# Patient Record
Sex: Male | Born: 1960 | Race: White | Hispanic: No | Marital: Married | Smoking: Former smoker
Health system: Southern US, Community
[De-identification: ages and names within clinical notes are randomized; demographics above are authoritative.]

## PROBLEM LIST (undated history)

## (undated) DIAGNOSIS — Z860100 Personal history of colon polyps, unspecified: Secondary | ICD-10-CM

## (undated) DIAGNOSIS — G8929 Other chronic pain: Secondary | ICD-10-CM

## (undated) DIAGNOSIS — G4700 Insomnia, unspecified: Secondary | ICD-10-CM

## (undated) DIAGNOSIS — Z87442 Personal history of urinary calculi: Secondary | ICD-10-CM

## (undated) DIAGNOSIS — I10 Essential (primary) hypertension: Secondary | ICD-10-CM

## (undated) DIAGNOSIS — E785 Hyperlipidemia, unspecified: Secondary | ICD-10-CM

## (undated) DIAGNOSIS — N390 Urinary tract infection, site not specified: Secondary | ICD-10-CM

## (undated) DIAGNOSIS — K649 Unspecified hemorrhoids: Secondary | ICD-10-CM

## (undated) DIAGNOSIS — M6530 Trigger finger, unspecified finger: Secondary | ICD-10-CM

## (undated) DIAGNOSIS — K429 Umbilical hernia without obstruction or gangrene: Secondary | ICD-10-CM

## (undated) DIAGNOSIS — M549 Dorsalgia, unspecified: Secondary | ICD-10-CM

## (undated) DIAGNOSIS — J302 Other seasonal allergic rhinitis: Secondary | ICD-10-CM

## (undated) DIAGNOSIS — L409 Psoriasis, unspecified: Secondary | ICD-10-CM

## (undated) DIAGNOSIS — R21 Rash and other nonspecific skin eruption: Secondary | ICD-10-CM

## (undated) HISTORY — PX: LIPOMA EXCISION: SHX5283

## (undated) HISTORY — PX: COLONOSCOPY: SHX174

## (undated) HISTORY — PX: TENDON REPAIR: SHX5111

## (undated) HISTORY — PX: SHOULDER SURGERY: SHX246

## (undated) HISTORY — DX: Hyperlipidemia, unspecified: E78.5

## (undated) HISTORY — PX: HERNIA REPAIR: SHX51

---

## 2006-02-06 HISTORY — PX: CYSTECTOMY: SUR359

## 2006-03-30 ENCOUNTER — Ambulatory Visit (HOSPITAL_BASED_OUTPATIENT_CLINIC_OR_DEPARTMENT_OTHER): Admission: RE | Admit: 2006-03-30 | Discharge: 2006-03-30 | Payer: Self-pay | Admitting: General Surgery

## 2006-03-30 ENCOUNTER — Encounter (INDEPENDENT_AMBULATORY_CARE_PROVIDER_SITE_OTHER): Payer: Self-pay | Admitting: Specialist

## 2008-03-22 ENCOUNTER — Emergency Department (HOSPITAL_BASED_OUTPATIENT_CLINIC_OR_DEPARTMENT_OTHER): Admission: EM | Admit: 2008-03-22 | Discharge: 2008-03-22 | Payer: Self-pay | Admitting: Emergency Medicine

## 2008-03-22 ENCOUNTER — Ambulatory Visit: Payer: Self-pay | Admitting: Diagnostic Radiology

## 2010-05-24 LAB — URINALYSIS, ROUTINE W REFLEX MICROSCOPIC
Ketones, ur: 15 mg/dL — AB
Nitrite: NEGATIVE
Protein, ur: 100 mg/dL — AB
Specific Gravity, Urine: 1.022 (ref 1.005–1.030)
Urobilinogen, UA: 1 mg/dL (ref 0.0–1.0)

## 2010-05-24 LAB — BASIC METABOLIC PANEL
CO2: 25 mEq/L (ref 19–32)
Chloride: 105 mEq/L (ref 96–112)
GFR calc Af Amer: >60 mL/min (ref >60)
Potassium: 4.2 mEq/L (ref 3.5–5.1)
Sodium: 141 mEq/L (ref 135–145)

## 2010-05-24 LAB — DIFFERENTIAL
Basophils Relative: 0 % (ref 0–1)
Eosinophils Absolute: 0.2 10*3/uL (ref 0.0–0.7)
Lymphs Abs: 2.8 10*3/uL (ref 0.7–4.0)
Monocytes Absolute: 0.5 10*3/uL (ref 0.1–1.0)
Monocytes Relative: 4 % (ref 3–12)

## 2010-05-24 LAB — CBC
Hemoglobin: 15.9 g/dL (ref 13.0–17.0)
MCHC: 33.7 g/dL (ref 30.0–36.0)
MCV: 90.6 fL (ref 78.0–100.0)
RBC: 5.22 MIL/uL (ref 4.22–5.81)

## 2010-06-13 ENCOUNTER — Other Ambulatory Visit: Payer: Self-pay | Admitting: Gastroenterology

## 2010-06-15 ENCOUNTER — Other Ambulatory Visit: Payer: Self-pay | Admitting: Gastroenterology

## 2010-06-15 ENCOUNTER — Ambulatory Visit
Admission: RE | Admit: 2010-06-15 | Discharge: 2010-06-15 | Disposition: A | Discharge status: Home / Self Care | Payer: 59 | Source: Ambulatory Visit | Attending: Gastroenterology | Admitting: Gastroenterology

## 2010-06-15 DIAGNOSIS — R109 Unspecified abdominal pain: Secondary | ICD-10-CM

## 2010-06-16 ENCOUNTER — Other Ambulatory Visit: Payer: Self-pay | Admitting: Gastroenterology

## 2010-06-16 ENCOUNTER — Ambulatory Visit
Admission: RE | Admit: 2010-06-16 | Discharge: 2010-06-16 | Disposition: A | Discharge status: Home / Self Care | Payer: 59 | Source: Ambulatory Visit | Attending: Gastroenterology | Admitting: Gastroenterology

## 2010-06-16 DIAGNOSIS — R112 Nausea with vomiting, unspecified: Secondary | ICD-10-CM

## 2010-06-16 MED ORDER — IOHEXOL 300 MG/ML  SOLN
100.0000 mL | Freq: Once | INTRAMUSCULAR | Status: AC | PRN
  Administered 2010-06-16: 100 mL via INTRAVENOUS

## 2010-06-24 NOTE — Op Note (Signed)
NAMEDEMETRI, Jeffery [name]               ACCOUNT NO.:  [id number]   MEDICAL RECORD NO.:  [id number]          PATIENT TYPE:  AMB   LOCATION:  DSC                          FACILITY:  [facility]   PHYSICIAN:  Cherylynn Ridges, M.D.    DATE OF BIRTH:  Jun 20, 1960   DATE OF PROCEDURE:  03/30/2006  DATE OF DISCHARGE:                               OPERATIVE REPORT   PREOPERATIVE DIAGNOSES:  1. Lipoma of central back.  2. Unknown lesion of the left upper back.   POSTOPERATIVE DIAGNOSES:  1. Lipoma measuring 5 x 3 cm in size.  2. Sebaceous cyst of left upper back measuring 2 cm x 2 cm in size.   PROCEDURE:  1. Excision of lipoma.  2. Excision of left upper back sebaceous cyst.   SURGEON:  Cherylynn Ridges, M.D.   ASSISTANT:  No assistant.   ANESTHESIA:  Monitored anesthesia care with 1% Xylocaine with  epinephrine local.   ESTIMATED BLOOD LOSS:  Less than 30 mL.   COMPLICATIONS:  None.   CONDITION:  Stable.   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   OPERATION:  The patient was taken to the operating room, placed on table  initially in the supine position.  After an adequate amount of sedation  was given, he was placed in the prone position and his back prepped and  draped in usual sterile manner.   We excised the central lipoma initially.  We marked it with marking pen  and then anesthetized the area with 1% Xylocaine approximately 3 to 4 mL  were used.  We used a 15 blade to make the initial incision then we  dissected out the lipoma.  Electrocautery and a figure-of-eight suture  ligature of 3-0 Vicryl used to control hemostasis.  We irrigated it with  saline solution and closed in two layers of 3-0 Vicryl subcu layer and 4-  0 Monocryl subcuticular layer for the skin closure.  The lipoma was sent  off as a separate specimen from the left upper back which was  secondarily excised.   After  resecting the initial lump, the one in the left upper back was  anesthetized in a similar manner with about 3 mL of lidocaine.  An  incision was made with 15 blade and we dissected out what was  subsequently found to be a sebaceous cyst with minimal difficulty using  mostly Metzenbaum scissors to dissect it free from the surrounding  tissue.  We closed it in a single layer of running subcuticular Monocryl  4-0.  Sterile dressings were applied to both wounds.  They were  irrigated with saline.  Of note was that the upper flap on the sebaceous  cyst incision was thin because of the cyst wall and its position  underneath the skin may present a problem with healing subsequently, but  should be handled without problem as an outpatient.  Sterile dressings  were applied to both wounds.      Cherylynn Ridges, M.D.  Electronically Signed  JOW/MEDQ  D:  03/30/2006  T:  03/30/2006  Job:  [id number]

## 2011-08-31 ENCOUNTER — Ambulatory Visit (INDEPENDENT_AMBULATORY_CARE_PROVIDER_SITE_OTHER): Payer: 59 | Admitting: Surgery

## 2011-08-31 ENCOUNTER — Encounter (INDEPENDENT_AMBULATORY_CARE_PROVIDER_SITE_OTHER): Payer: Self-pay | Admitting: Surgery

## 2011-08-31 VITALS — BP 160/102 | HR 80 | Temp 98.2°F | Resp 16 | Ht 71.0 in | Wt 208.6 lb

## 2011-08-31 DIAGNOSIS — R229 Localized swelling, mass and lump, unspecified: Secondary | ICD-10-CM

## 2011-08-31 DIAGNOSIS — R2240 Localized swelling, mass and lump, unspecified lower limb: Secondary | ICD-10-CM

## 2011-08-31 DIAGNOSIS — R2230 Localized swelling, mass and lump, unspecified upper limb: Secondary | ICD-10-CM

## 2011-08-31 NOTE — Progress Notes (Signed)
Subjective:     Patient ID: Jeffery [name], male   DOB: 1960/07/13, 51 y.o.   MRN: [id number]  HPI  Jeffery [name]  [birth year] [id number]  Patient Care Team: Bradd Burner as PCP - General (Physician Assistant)  This patient is a 51 y.o.male who presents today for surgical evaluation at the request of Dr. Janace Litten.  Reason for evaluation: Mass on right leg.  Patient is a pleasant active male.  He has a history of lipomas.  A few were removed on his back and arm by Dr. Lindie Spruce in our group in 2008.  Patient has some SQ lumps in his right forearm and in his right calf.  The one in the right calf was concerning to his primary care physician and therefore the patient was sent to [place] for evaluation.   It's been there for several years.  They've not changed in size.  Unlike the prior masses, these are not particularly bothersome.  No fevers or chills.  No new lumps.  No history of melanomas or skin cancers.  Has a few small moles but they've not changed in size.  He comes today with his wife  There is no problem list on file for this patient.   Past Medical History  Diagnosis Date  . Hyperlipidemia     Past Surgical History  Procedure Date  . Cystectomy     back  . Shoulder surgery     bone spur  . Tendon repair     ankle    History   Social History  . Marital Status: Married    Spouse Name: N/A    Number of Children: N/A  . Years of Education: N/A   Occupational History  . Not on file.   Social History Main Topics  . Smoking status: Former Games developer  . Smokeless tobacco: Not on file  . Alcohol Use: No  . Drug Use: No  . Sexually Active:    Other Topics Concern  . Not on file   Social History Narrative  . No narrative on file    Family History  Problem Relation Age of Onset  . Cancer Mother     breast   . Cancer Maternal Grandfather     lung    Current Outpatient Prescriptions  Medication Sig Dispense Refill  . Cyanocobalamin (VITAMIN B-12 CR PO) Take by  mouth.      . fish oil-omega-3 fatty acids 1000 MG capsule Take 2 g by mouth daily.      . NON FORMULARY Juice Plus      . rosuvastatin (CRESTOR) 5 MG tablet Take 2.5 mg by mouth daily.      Marland Kitchen testosterone enanthate (DELATESTRYL) 200 MG/ML injection Inject 200 mg into the muscle every 14 (fourteen) days. For IM use only      . zolpidem (AMBIEN) 10 MG tablet Take 10 mg by mouth at bedtime as needed.         Allergies  Allergen Reactions  . Morphine And Related Itching    BP 160/102  Pulse 80  Temp 98.2 F (36.8 C) (Temporal)  Resp 16  Ht 5\' 11"  (1.803 m)  Wt 208 lb 9.6 oz (94.62 kg)  BMI 29.09 kg/m2  No results found.   Review of Systems  Constitutional: Negative for fever, chills and diaphoresis.  HENT: Negative for nosebleeds, sore throat, facial swelling, mouth sores, trouble swallowing and ear discharge.   Eyes: Negative for photophobia, discharge and visual disturbance.  Respiratory: Negative for  choking, chest tightness, shortness of breath and stridor.   Cardiovascular: Negative for chest pain and palpitations.  Gastrointestinal: Negative for nausea, vomiting, abdominal pain, diarrhea, constipation, blood in stool, abdominal distention, anal bleeding and rectal pain.  Genitourinary: Negative for dysuria, urgency, difficulty urinating and testicular pain.  Musculoskeletal: Negative for myalgias, back pain, arthralgias and gait problem.  Skin: Negative for color change, pallor, rash and wound.  Neurological: Negative for dizziness, speech difficulty, weakness, numbness and headaches.  Hematological: Negative for adenopathy. Does not bruise/bleed easily.  Psychiatric/Behavioral: Negative for hallucinations, confusion and agitation.       Objective:   Physical Exam  Constitutional: He is oriented to person, place, and time. He appears well-developed and well-nourished. No distress.  HENT:  Head: Normocephalic.  Mouth/Throat: Oropharynx is clear and moist. No  oropharyngeal exudate.  Eyes: Conjunctivae and EOM are normal. Pupils are equal, round, and reactive to light. No scleral icterus.  Neck: Normal range of motion. Neck supple. No tracheal deviation present.  Cardiovascular: Normal rate, regular rhythm and intact distal pulses.   Pulmonary/Chest: Effort normal and breath sounds normal. No respiratory distress.  Abdominal: Soft. He exhibits no distension. There is no tenderness. Hernia confirmed negative in the right inguinal area and confirmed negative in the left inguinal area.  Musculoskeletal: Normal range of motion. He exhibits no tenderness.       Arms:      Legs: Lymphadenopathy:    He has no cervical adenopathy.       Right: No inguinal adenopathy present.       Left: No inguinal adenopathy present.  Neurological: He is alert and oriented to person, place, and time. No cranial nerve deficit. He exhibits normal muscle tone. Coordination normal.  Skin: Skin is warm and dry. No rash noted. He is not diaphoretic. No erythema. No pallor.  Psychiatric: He has a normal mood and affect. His behavior is normal. Judgment and thought content normal.       Assessment:     Subcutaneous masses most consistent with lipomas in a patient with prior lipoma excisions    Plan:     Because the masses have not changed in size nor giving him symptoms, I do not feel strongly that they need to be removed.  The patient feels reassured.  He was managed in removal at this time.  If something changes he wished to have them removed, I think they're superficial enough that they can be removed in the office in a minor or setting with local anesthetic only:  The pathophysiology of skin & subcutaneous masses was discussed.  Natural history risks without surgery were discussed.  I recommended surgery to remove the mass.  I explained the technique of removal with use of local anesthesia & possible need for more aggressive sedation/anesthesia for patient comfort.     Risks such as bleeding, infection, heart attack, death, and other risks were discussed.  I noted a good likelihood this will help address the problem.   Possibility that this will not correct all symptoms was explained. Possibility of regrowth/recurrence of the mass was discussed.  We will work to minimize complications. Questions were answered.  The patient expresses understanding & wishes to hold off with surgery.

## 2011-08-31 NOTE — Patient Instructions (Addendum)
We believe that you have a: Lipoma A lipoma is a noncancerous (benign) tumor composed of fat cells. They are usually found under the skin (subcutaneous). A lipoma may occur in any tissue of the body that contains fat. Common areas for lipomas to appear include the back, shoulders, buttocks, and thighs. Lipomas are a very common soft tissue growth. They are soft and grow slowly. Most problems caused by a lipoma depend on where it is growing. DIAGNOSIS  A lipoma can be diagnosed with a physical exam. These tumors rarely become cancerous, but radiographic studies can help determine this for certain. Studies used may include:  Computerized X-ray scans (CT or CAT scan).   Computerized magnetic scans (MRI).  TREATMENT  Small lipomas that are not causing problems may be watched. If a lipoma continues to enlarge or causes problems, removal is often the best treatment. Lipomas can also be removed to improve appearance. Surgery is done to remove the fatty cells and the surrounding capsule. Most often, this is done with medicine that numbs the area (local anesthetic). The removed tissue is examined under a microscope to make sure it is not cancerous. Keep all follow-up appointments with your caregiver. SEEK MEDICAL CARE IF:   The lipoma becomes larger or hard.   The lipoma becomes painful, red, or increasingly swollen. These could be signs of infection or a more serious condition.  Document Released: 01/13/2002 Document Revised: 01/12/2011 Document Reviewed: 06/25/2009 [facility] Patient Information 2012 [place], [place].

## 2011-09-13 ENCOUNTER — Ambulatory Visit (INDEPENDENT_AMBULATORY_CARE_PROVIDER_SITE_OTHER): Payer: 59 | Admitting: Surgery

## 2012-01-11 ENCOUNTER — Other Ambulatory Visit: Payer: Self-pay | Admitting: Family Medicine

## 2012-01-11 ENCOUNTER — Ambulatory Visit
Admission: RE | Admit: 2012-01-11 | Discharge: 2012-01-11 | Disposition: A | Discharge status: Home / Self Care | Payer: 59 | Source: Ambulatory Visit | Attending: Family Medicine | Admitting: Family Medicine

## 2012-01-11 DIAGNOSIS — R3915 Urgency of urination: Secondary | ICD-10-CM

## 2012-01-11 DIAGNOSIS — R509 Fever, unspecified: Secondary | ICD-10-CM

## 2012-01-11 DIAGNOSIS — R310 Gross hematuria: Secondary | ICD-10-CM

## 2012-01-11 DIAGNOSIS — M549 Dorsalgia, unspecified: Secondary | ICD-10-CM

## 2012-03-27 ENCOUNTER — Other Ambulatory Visit: Payer: Self-pay | Admitting: Neurosurgery

## 2012-04-19 ENCOUNTER — Encounter (HOSPITAL_COMMUNITY): Payer: Self-pay | Admitting: Pharmacy Technician

## 2012-04-30 ENCOUNTER — Ambulatory Visit (HOSPITAL_COMMUNITY)
Admission: RE | Admit: 2012-04-30 | Discharge: 2012-04-30 | Disposition: A | Discharge status: Home / Self Care | Payer: 59 | Source: Ambulatory Visit | Attending: Anesthesiology | Admitting: Anesthesiology

## 2012-04-30 ENCOUNTER — Encounter (HOSPITAL_COMMUNITY): Payer: Self-pay

## 2012-04-30 ENCOUNTER — Encounter (HOSPITAL_COMMUNITY)
Admission: RE | Admit: 2012-04-30 | Discharge: 2012-04-30 | Disposition: A | Discharge status: Home / Self Care | Payer: 59 | Source: Ambulatory Visit | Attending: Neurosurgery | Admitting: Neurosurgery

## 2012-04-30 DIAGNOSIS — Z01812 Encounter for preprocedural laboratory examination: Secondary | ICD-10-CM | POA: Insufficient documentation

## 2012-04-30 DIAGNOSIS — Z01810 Encounter for preprocedural cardiovascular examination: Secondary | ICD-10-CM | POA: Insufficient documentation

## 2012-04-30 DIAGNOSIS — Z01818 Encounter for other preprocedural examination: Secondary | ICD-10-CM | POA: Insufficient documentation

## 2012-04-30 DIAGNOSIS — Z01811 Encounter for preprocedural respiratory examination: Secondary | ICD-10-CM | POA: Insufficient documentation

## 2012-04-30 HISTORY — DX: Dorsalgia, unspecified: M54.9

## 2012-04-30 HISTORY — DX: Psoriasis, unspecified: L40.9

## 2012-04-30 HISTORY — DX: Unspecified hemorrhoids: K64.9

## 2012-04-30 HISTORY — DX: Essential (primary) hypertension: I10

## 2012-04-30 HISTORY — DX: Personal history of urinary calculi: Z87.442

## 2012-04-30 HISTORY — DX: Insomnia, unspecified: G47.00

## 2012-04-30 HISTORY — DX: Urinary tract infection, site not specified: N39.0

## 2012-04-30 HISTORY — DX: Personal history of colonic polyps: Z86.010

## 2012-04-30 HISTORY — DX: Other seasonal allergic rhinitis: J30.2

## 2012-04-30 HISTORY — DX: Personal history of colon polyps, unspecified: Z86.0100

## 2012-04-30 HISTORY — DX: Trigger finger, unspecified finger: M65.30

## 2012-04-30 HISTORY — DX: Rash and other nonspecific skin eruption: R21

## 2012-04-30 HISTORY — DX: Other chronic pain: G89.29

## 2012-04-30 HISTORY — DX: Umbilical hernia without obstruction or gangrene: K42.9

## 2012-04-30 LAB — BASIC METABOLIC PANEL
Chloride: 109 mEq/L (ref 96–112)
GFR calc Af Amer: >90 mL/min (ref >90)
GFR calc non Af Amer: >90 mL/min (ref >90)
Potassium: 3.9 mEq/L (ref 3.5–5.1)
Sodium: 141 mEq/L (ref 135–145)

## 2012-04-30 LAB — TYPE AND SCREEN
ABO/RH(D): A POS
Antibody Screen: NEGATIVE

## 2012-04-30 LAB — SURGICAL PCR SCREEN
MRSA, PCR: NEGATIVE
Staphylococcus aureus: NEGATIVE

## 2012-04-30 LAB — CBC
HCT: 50.3 % (ref 39.0–52.0)
Hemoglobin: 18.2 g/dL — ABNORMAL HIGH (ref 13.0–17.0)
RBC: 5.72 MIL/uL (ref 4.22–5.81)

## 2012-04-30 NOTE — Pre-Procedure Instructions (Signed)
Pleasant Bensinger  04/30/2012   Your procedure is scheduled on:  Wed, April 2 @ 10:00 AM  Report to [facility] at 7:00 AM.  Call this number if you have problems the morning of surgery: [phone]   Remember:   Do not eat food or drink liquids after midnight.   Take these medicines the morning of surgery with A SIP OF WATER: Claritin(Loratadine)               Stop taking your Fish Oil.No Aspirins,Goody's,BC's,Ibuprofen,or Herbal Medications   Do not wear jewelry  Do not wear lotions, powders, or colognes. You may wear deodorant.             Men may shave face and neck.  Do not bring valuables to the hospital.  Contacts, dentures or bridgework may not be worn into surgery.  Leave suitcase in the car. After surgery it may be brought to your room.  For patients admitted to the hospital, checkout time is 11:00 AM the day of  discharge.   Patients discharged the day of surgery will not be allowed to drive  home.    Special Instructions: Shower using CHG 2 nights before surgery and the night before surgery.  If you shower the day of surgery use CHG.  Use special wash - you have one bottle of CHG for all showers.  You should use approximately 1/3 of the bottle for each shower.   Please read over the following fact sheets that you were given: Pain Booklet, Coughing and Deep Breathing, Blood Transfusion Information, MRSA Information and Surgical Site Infection Prevention

## 2012-04-30 NOTE — Progress Notes (Signed)
Pt doesn't have a cardiologist  Denies ever having an echo/stress test/heart cath  Dr.Christian Janace Litten is Medical Md at [facility]  Denies EKG or CXR in past yr

## 2012-05-07 MED ORDER — CEFAZOLIN SODIUM-DEXTROSE 2-3 GM-% IV SOLR
2.0000 g | INTRAVENOUS | Status: AC
  Administered 2012-05-08: 2 g via INTRAVENOUS
  Filled 2012-05-07 (×2): qty 50

## 2012-05-08 ENCOUNTER — Ambulatory Visit (HOSPITAL_COMMUNITY): Payer: 59

## 2012-05-08 ENCOUNTER — Encounter (HOSPITAL_COMMUNITY)
Admission: RE | Disposition: A | Discharge status: Home / Self Care | Payer: Self-pay | Source: Ambulatory Visit | Attending: Neurosurgery

## 2012-05-08 ENCOUNTER — Ambulatory Visit (HOSPITAL_COMMUNITY): Payer: 59 | Admitting: Anesthesiology

## 2012-05-08 ENCOUNTER — Encounter (HOSPITAL_COMMUNITY): Payer: Self-pay | Admitting: Anesthesiology

## 2012-05-08 ENCOUNTER — Encounter (HOSPITAL_COMMUNITY): Payer: Self-pay | Admitting: *Deleted

## 2012-05-08 ENCOUNTER — Inpatient Hospital Stay (HOSPITAL_COMMUNITY)
Admission: RE | Admit: 2012-05-08 | Discharge: 2012-05-10 | DRG: 460 | Disposition: A | Discharge status: Home / Self Care | Payer: 59 | Source: Ambulatory Visit | Attending: Neurosurgery | Admitting: Neurosurgery

## 2012-05-08 DIAGNOSIS — I10 Essential (primary) hypertension: Secondary | ICD-10-CM | POA: Diagnosis present

## 2012-05-08 DIAGNOSIS — Q762 Congenital spondylolisthesis: Principal | ICD-10-CM

## 2012-05-08 DIAGNOSIS — L408 Other psoriasis: Secondary | ICD-10-CM | POA: Diagnosis present

## 2012-05-08 DIAGNOSIS — E785 Hyperlipidemia, unspecified: Secondary | ICD-10-CM | POA: Diagnosis present

## 2012-05-08 DIAGNOSIS — Z87891 Personal history of nicotine dependence: Secondary | ICD-10-CM

## 2012-05-08 DIAGNOSIS — Z79899 Other long term (current) drug therapy: Secondary | ICD-10-CM

## 2012-05-08 DIAGNOSIS — Z860100 Personal history of colon polyps, unspecified: Secondary | ICD-10-CM

## 2012-05-08 DIAGNOSIS — G4700 Insomnia, unspecified: Secondary | ICD-10-CM | POA: Diagnosis present

## 2012-05-08 DIAGNOSIS — Z87442 Personal history of urinary calculi: Secondary | ICD-10-CM

## 2012-05-08 SURGERY — POSTERIOR LUMBAR FUSION 1 LEVEL
Anesthesia: General | Site: Back | Wound class: Clean

## 2012-05-08 MED ORDER — FENTANYL CITRATE 0.05 MG/ML IJ SOLN
INTRAMUSCULAR | Status: DC | PRN
  Administered 2012-05-08 (×2): 50 ug via INTRAVENOUS
  Administered 2012-05-08: 100 ug via INTRAVENOUS
  Administered 2012-05-08 (×4): 50 ug via INTRAVENOUS

## 2012-05-08 MED ORDER — ACETAMINOPHEN 325 MG PO TABS
650.0000 mg | ORAL_TABLET | ORAL | Status: DC | PRN
  Administered 2012-05-10 (×4): 650 mg via ORAL
  Filled 2012-05-08 (×5): qty 2

## 2012-05-08 MED ORDER — PHENYLEPHRINE HCL 10 MG/ML IJ SOLN
INTRAMUSCULAR | Status: DC | PRN
Start: 2012-05-08 — End: 2012-05-08
  Administered 2012-05-08: 80 ug via INTRAVENOUS

## 2012-05-08 MED ORDER — FENTANYL CITRATE 0.05 MG/ML IJ SOLN
25.0000 ug | INTRAMUSCULAR | Status: DC | PRN
  Administered 2012-05-08 (×2): 50 ug via INTRAVENOUS

## 2012-05-08 MED ORDER — HYDROMORPHONE 0.3 MG/ML IV SOLN
INTRAVENOUS | Status: AC
  Filled 2012-05-08: qty 25

## 2012-05-08 MED ORDER — GLYCOPYRROLATE 0.2 MG/ML IJ SOLN
INTRAMUSCULAR | Status: DC | PRN
Start: 2012-05-08 — End: 2012-05-08
  Administered 2012-05-08: .5 mg via INTRAVENOUS

## 2012-05-08 MED ORDER — SODIUM CHLORIDE 0.9 % IJ SOLN
3.0000 mL | Freq: Two times a day (BID) | INTRAMUSCULAR | Status: DC
  Administered 2012-05-09: 3 mL via INTRAVENOUS

## 2012-05-08 MED ORDER — SODIUM CHLORIDE 0.9 % IV SOLN: INTRAVENOUS | Status: DC

## 2012-05-08 MED ORDER — OXYCODONE-ACETAMINOPHEN 5-325 MG PO TABS
1.0000 | ORAL_TABLET | ORAL | Status: DC | PRN
  Administered 2012-05-08 – 2012-05-09 (×2): 2 via ORAL
  Filled 2012-05-08: qty 2

## 2012-05-08 MED ORDER — POLYETHYLENE GLYCOL 3350 17 G PO PACK
17.0000 g | PACK | Freq: Every day | ORAL | Status: DC | PRN
  Filled 2012-05-08: qty 1

## 2012-05-08 MED ORDER — BUPIVACAINE-EPINEPHRINE PF 0.5-1:200000 % IJ SOLN: INTRAMUSCULAR | Status: DC | PRN

## 2012-05-08 MED ORDER — MIDAZOLAM HCL 2 MG/2ML IJ SOLN: 1.0000 mg | INTRAMUSCULAR | Status: DC | PRN

## 2012-05-08 MED ORDER — ONDANSETRON HCL 4 MG/2ML IJ SOLN
4.0000 mg | INTRAMUSCULAR | Status: DC | PRN
  Administered 2012-05-09: 4 mg via INTRAVENOUS
  Filled 2012-05-08: qty 2

## 2012-05-08 MED ORDER — NALOXONE HCL 0.4 MG/ML IJ SOLN: 0.4000 mg | INTRAMUSCULAR | Status: DC | PRN

## 2012-05-08 MED ORDER — ARMODAFINIL 150 MG PO TABS: 150.0000 mg | ORAL_TABLET | Freq: Every day | ORAL | Status: DC

## 2012-05-08 MED ORDER — FENTANYL CITRATE 0.05 MG/ML IJ SOLN
INTRAMUSCULAR | Status: AC
  Filled 2012-05-08: qty 2

## 2012-05-08 MED ORDER — ACETAMINOPHEN 10 MG/ML IV SOLN
1000.0000 mg | Freq: Once | INTRAVENOUS | Status: AC
  Administered 2012-05-08: 1000 mg via INTRAVENOUS
  Filled 2012-05-08: qty 100

## 2012-05-08 MED ORDER — DIPHENHYDRAMINE HCL 12.5 MG/5ML PO ELIX: 12.5000 mg | ORAL_SOLUTION | Freq: Four times a day (QID) | ORAL | Status: DC | PRN

## 2012-05-08 MED ORDER — DIAZEPAM 5 MG PO TABS
ORAL_TABLET | ORAL | Status: AC
  Filled 2012-05-08: qty 1

## 2012-05-08 MED ORDER — MIDAZOLAM HCL 5 MG/5ML IJ SOLN
INTRAMUSCULAR | Status: DC | PRN
  Administered 2012-05-08: 2 mg via INTRAVENOUS

## 2012-05-08 MED ORDER — CEFAZOLIN SODIUM 1-5 GM-% IV SOLN
1.0000 g | Freq: Three times a day (TID) | INTRAVENOUS | Status: AC
  Administered 2012-05-08 – 2012-05-09 (×2): 1 g via INTRAVENOUS
  Filled 2012-05-08 (×2): qty 50

## 2012-05-08 MED ORDER — ACETAMINOPHEN 650 MG RE SUPP: 650.0000 mg | RECTAL | Status: DC | PRN

## 2012-05-08 MED ORDER — PHENOL 1.4 % MT LIQD: 1.0000 | OROMUCOSAL | Status: DC | PRN

## 2012-05-08 MED ORDER — LACTATED RINGERS IV SOLN
INTRAVENOUS | Status: DC | PRN
  Administered 2012-05-08 (×3): via INTRAVENOUS

## 2012-05-08 MED ORDER — SURGIFOAM 100 EX MISC
CUTANEOUS | Status: DC | PRN
  Administered 2012-05-08: 12:00:00 via TOPICAL

## 2012-05-08 MED ORDER — ATORVASTATIN CALCIUM 10 MG PO TABS
10.0000 mg | ORAL_TABLET | Freq: Every day | ORAL | Status: DC
  Administered 2012-05-09: 10 mg via ORAL
  Filled 2012-05-08 (×3): qty 1

## 2012-05-08 MED ORDER — ARTIFICIAL TEARS OP OINT
TOPICAL_OINTMENT | OPHTHALMIC | Status: DC | PRN
  Administered 2012-05-08: 1 via OPHTHALMIC

## 2012-05-08 MED ORDER — 0.9 % SODIUM CHLORIDE (POUR BTL) OPTIME
TOPICAL | Status: DC | PRN
  Administered 2012-05-08: 1000 mL

## 2012-05-08 MED ORDER — OXYCODONE-ACETAMINOPHEN 5-325 MG PO TABS
ORAL_TABLET | ORAL | Status: AC
  Filled 2012-05-08: qty 2

## 2012-05-08 MED ORDER — DIPHENHYDRAMINE HCL 50 MG/ML IJ SOLN: 12.5000 mg | Freq: Four times a day (QID) | INTRAMUSCULAR | Status: DC | PRN

## 2012-05-08 MED ORDER — SODIUM CHLORIDE 0.9 % IJ SOLN: 9.0000 mL | INTRAMUSCULAR | Status: DC | PRN

## 2012-05-08 MED ORDER — BUPIVACAINE LIPOSOME 1.3 % IJ SUSP
20.0000 mL | INTRAMUSCULAR | Status: AC
  Filled 2012-05-08: qty 20

## 2012-05-08 MED ORDER — ACETAMINOPHEN 10 MG/ML IV SOLN
INTRAVENOUS | Status: AC
  Filled 2012-05-08: qty 100

## 2012-05-08 MED ORDER — BUPIVACAINE LIPOSOME 1.3 % IJ SUSP
INTRAMUSCULAR | Status: DC | PRN
  Administered 2012-05-08: 20 mL

## 2012-05-08 MED ORDER — SODIUM CHLORIDE 0.9 % IJ SOLN: 3.0000 mL | INTRAMUSCULAR | Status: DC | PRN

## 2012-05-08 MED ORDER — SODIUM CHLORIDE 0.9 % IV SOLN: 250.0000 mL | INTRAVENOUS | Status: DC

## 2012-05-08 MED ORDER — MENTHOL 3 MG MT LOZG: 1.0000 | LOZENGE | OROMUCOSAL | Status: DC | PRN

## 2012-05-08 MED ORDER — LIDOCAINE HCL (CARDIAC) 20 MG/ML IV SOLN
INTRAVENOUS | Status: DC | PRN
  Administered 2012-05-08: 100 mg via INTRAVENOUS

## 2012-05-08 MED ORDER — ONDANSETRON HCL 4 MG/2ML IJ SOLN: 4.0000 mg | Freq: Four times a day (QID) | INTRAMUSCULAR | Status: DC | PRN

## 2012-05-08 MED ORDER — ROCURONIUM BROMIDE 100 MG/10ML IV SOLN
INTRAVENOUS | Status: DC | PRN
Start: 2012-05-08 — End: 2012-05-08
  Administered 2012-05-08: 50 mg via INTRAVENOUS

## 2012-05-08 MED ORDER — NEOSTIGMINE METHYLSULFATE 1 MG/ML IJ SOLN
INTRAMUSCULAR | Status: DC | PRN
  Administered 2012-05-08: 4 mg via INTRAVENOUS

## 2012-05-08 MED ORDER — ZOLPIDEM TARTRATE 5 MG PO TABS: 10.0000 mg | ORAL_TABLET | Freq: Every evening | ORAL | Status: DC | PRN

## 2012-05-08 MED ORDER — DIAZEPAM 5 MG PO TABS
5.0000 mg | ORAL_TABLET | Freq: Four times a day (QID) | ORAL | Status: DC | PRN
  Administered 2012-05-08 – 2012-05-09 (×3): 5 mg via ORAL
  Filled 2012-05-08 (×2): qty 1

## 2012-05-08 MED ORDER — VECURONIUM BROMIDE 10 MG IV SOLR
INTRAVENOUS | Status: DC | PRN
Start: 2012-05-08 — End: 2012-05-08
  Administered 2012-05-08: 2 mg via INTRAVENOUS
  Administered 2012-05-08: 3 mg via INTRAVENOUS
  Administered 2012-05-08: 2 mg via INTRAVENOUS

## 2012-05-08 MED ORDER — HYDROMORPHONE 0.3 MG/ML IV SOLN
INTRAVENOUS | Status: DC
  Administered 2012-05-08: 3 mg via INTRAVENOUS
  Administered 2012-05-08: 14:00:00 via INTRAVENOUS
  Administered 2012-05-09: 1.2 mg via INTRAVENOUS
  Administered 2012-05-09: 1.5 mg via INTRAVENOUS

## 2012-05-08 MED ORDER — PROMETHAZINE HCL 25 MG/ML IJ SOLN: 6.2500 mg | INTRAMUSCULAR | Status: DC | PRN

## 2012-05-08 MED ORDER — PROPOFOL 10 MG/ML IV BOLUS
INTRAVENOUS | Status: DC | PRN
  Administered 2012-05-08: 200 mg via INTRAVENOUS

## 2012-05-08 MED ORDER — ONDANSETRON HCL 4 MG/2ML IJ SOLN
INTRAMUSCULAR | Status: DC | PRN
  Administered 2012-05-08: 4 mg via INTRAVENOUS

## 2012-05-08 MED ORDER — FENTANYL CITRATE 0.05 MG/ML IJ SOLN
50.0000 ug | Freq: Once | INTRAMUSCULAR | Status: AC
  Administered 2012-05-08: 50 ug via INTRAVENOUS

## 2012-05-08 SURGICAL SUPPLY — 74 items
APL SKNCLS STERI-STRIP NONHPOA (GAUZE/BANDAGES/DRESSINGS) ×1
BENZOIN TINCTURE PRP APPL 2/3 (GAUZE/BANDAGES/DRESSINGS) ×2 IMPLANT
BLADE SURG ROTATE 9660 (MISCELLANEOUS) IMPLANT
BONE EQUIVA 10CC (Bone Implant) ×1 IMPLANT
BUR ACORN 6.0 (BURR) ×2 IMPLANT
BUR MATCHSTICK NEURO 3.0 LAGG (BURR) ×2 IMPLANT
CANISTER SUCTION 2500CC (MISCELLANEOUS) ×2 IMPLANT
CAP REVERE LOCKING (Cap) ×4 IMPLANT
CLOTH BEACON ORANGE TIMEOUT ST (SAFETY) ×2 IMPLANT
CONN CROSSLINK REV 38-50MM (Connector) ×2 IMPLANT
CONNECTOR CRSLINK REV 38-50MM (Connector) IMPLANT
CONT SPEC 4OZ CLIKSEAL STRL BL (MISCELLANEOUS) ×3 IMPLANT
COVER BACK TABLE 24X17X13 BIG (DRAPES) IMPLANT
COVER TABLE BACK 60X90 (DRAPES) ×2 IMPLANT
DRAPE C-ARM 42X72 X-RAY (DRAPES) ×4 IMPLANT
DRAPE LAPAROTOMY 100X72X124 (DRAPES) ×2 IMPLANT
DRAPE POUCH INSTRU U-SHP 10X18 (DRAPES) ×2 IMPLANT
DRSG PAD ABDOMINAL 8X10 ST (GAUZE/BANDAGES/DRESSINGS) IMPLANT
DURAPREP 26ML APPLICATOR (WOUND CARE) ×2 IMPLANT
ELECT BLADE 4.0 EZ CLEAN MEGAD (MISCELLANEOUS) ×4
ELECT REM PT RETURN 9FT ADLT (ELECTROSURGICAL) ×2
ELECTRODE BLDE 4.0 EZ CLN MEGD (MISCELLANEOUS) IMPLANT
ELECTRODE REM PT RTRN 9FT ADLT (ELECTROSURGICAL) ×1 IMPLANT
EVACUATOR 1/8 PVC DRAIN (DRAIN) IMPLANT
GAUZE SPONGE 4X4 16PLY XRAY LF (GAUZE/BANDAGES/DRESSINGS) ×3 IMPLANT
GLOVE BIO SURGEON STRL SZ 6.5 (GLOVE) ×2 IMPLANT
GLOVE BIOGEL M 8.0 STRL (GLOVE) ×3 IMPLANT
GLOVE BIOGEL PI IND STRL 8 (GLOVE) IMPLANT
GLOVE BIOGEL PI INDICATOR 8 (GLOVE) ×1
GLOVE ECLIPSE 7.5 STRL STRAW (GLOVE) ×1 IMPLANT
GLOVE EXAM NITRILE LRG STRL (GLOVE) IMPLANT
GLOVE EXAM NITRILE MD LF STRL (GLOVE) IMPLANT
GLOVE EXAM NITRILE XL STR (GLOVE) IMPLANT
GLOVE EXAM NITRILE XS STR PU (GLOVE) IMPLANT
GLOVE SURG SS PI 6.5 STRL IVOR (GLOVE) ×1 IMPLANT
GOWN BRE IMP SLV AUR LG STRL (GOWN DISPOSABLE) ×3 IMPLANT
GOWN BRE IMP SLV AUR XL STRL (GOWN DISPOSABLE) ×4 IMPLANT
GOWN STRL REIN 2XL LVL4 (GOWN DISPOSABLE) ×1 IMPLANT
KIT BASIN OR (CUSTOM PROCEDURE TRAY) ×2 IMPLANT
KIT ROOM TURNOVER OR (KITS) ×2 IMPLANT
MILL MEDIUM DISP (BLADE) ×1 IMPLANT
NDL HYPO 18GX1.5 BLUNT FILL (NEEDLE) IMPLANT
NDL HYPO 21X1 ECLIPSE (NEEDLE) IMPLANT
NDL HYPO 21X1.5 SAFETY (NEEDLE) IMPLANT
NDL HYPO 25X1 1.5 SAFETY (NEEDLE) IMPLANT
NEEDLE HYPO 18GX1.5 BLUNT FILL (NEEDLE) IMPLANT
NEEDLE HYPO 21X1 ECLIPSE (NEEDLE) ×2 IMPLANT
NEEDLE HYPO 21X1.5 SAFETY (NEEDLE) IMPLANT
NEEDLE HYPO 25X1 1.5 SAFETY (NEEDLE) IMPLANT
NS IRRIG 1000ML POUR BTL (IV SOLUTION) ×2 IMPLANT
PACK LAMINECTOMY NEURO (CUSTOM PROCEDURE TRAY) ×2 IMPLANT
PAD ARMBOARD 7.5X6 YLW CONV (MISCELLANEOUS) ×6 IMPLANT
PATTIES SURGICAL .5 X1 (DISPOSABLE) ×1 IMPLANT
PATTIES SURGICAL .5 X3 (DISPOSABLE) IMPLANT
ROD REVERE 6.35 45MM (Rod) ×2 IMPLANT
SCREW REVERE 6.35 6.5MMX45 (Screw) ×4 IMPLANT
SPACER SUSTAIN O SML 8X22 8MM (Spacer) ×2 IMPLANT
SPONGE GAUZE 4X4 12PLY (GAUZE/BANDAGES/DRESSINGS) ×2 IMPLANT
SPONGE LAP 4X18 X RAY DECT (DISPOSABLE) IMPLANT
SPONGE NEURO XRAY DETECT 1X3 (DISPOSABLE) IMPLANT
SPONGE SURGIFOAM ABS GEL 100 (HEMOSTASIS) ×2 IMPLANT
STRIP CLOSURE SKIN 1/2X4 (GAUZE/BANDAGES/DRESSINGS) ×2 IMPLANT
SUT VIC AB 1 CT1 18XBRD ANBCTR (SUTURE) ×2 IMPLANT
SUT VIC AB 1 CT1 8-18 (SUTURE) ×4
SUT VIC AB 2-0 CP2 18 (SUTURE) ×3 IMPLANT
SUT VIC AB 3-0 SH 8-18 (SUTURE) ×2 IMPLANT
SYR 20CC LL (SYRINGE) IMPLANT
SYR 20ML ECCENTRIC (SYRINGE) ×2 IMPLANT
SYR 5ML LL (SYRINGE) ×1 IMPLANT
TAPE CLOTH SURG 4X10 WHT LF (GAUZE/BANDAGES/DRESSINGS) ×1 IMPLANT
TOWEL OR 17X24 6PK STRL BLUE (TOWEL DISPOSABLE) ×2 IMPLANT
TOWEL OR 17X26 10 PK STRL BLUE (TOWEL DISPOSABLE) ×3 IMPLANT
TRAY FOLEY CATH 14FRSI W/METER (CATHETERS) ×2 IMPLANT
WATER STERILE IRR 1000ML POUR (IV SOLUTION) ×2 IMPLANT

## 2012-05-08 NOTE — Preoperative (Signed)
Beta Blockers   Reason not to administer Beta Blockers:Not Applicable 

## 2012-05-08 NOTE — Anesthesia Preprocedure Evaluation (Addendum)
Anesthesia Evaluation  Patient identified by MRN, date of birth, ID band Patient awake    Reviewed: Allergy & Precautions, H&P , NPO status , Patient's Chart, lab work & pertinent test results  History of Anesthesia Complications (+) AWARENESS UNDER ANESTHESIANegative for: history of anesthetic complications  Airway Mallampati: III TM Distance: >3 FB Neck ROM: Full    Dental  (+) Teeth Intact and Dental Advisory Given   Pulmonary former smoker,  25 pack year smoking history. Quit 8 years ago. breath sounds clear to auscultation        Cardiovascular hypertension, Pt. on medications Rhythm:Regular Rate:Normal     Neuro/Psych  Neuromuscular disease negative psych ROS   GI/Hepatic negative GI ROS, Neg liver ROS,   Endo/Other  negative endocrine ROS  Renal/GU negative Renal ROS     Musculoskeletal   Abdominal   Peds  Hematology negative hematology ROS (+)   Anesthesia Other Findings   Reproductive/Obstetrics negative OB ROS                         Anesthesia Physical Anesthesia Plan  ASA: II  Anesthesia Plan: General   Post-op Pain Management:    Induction: Intravenous  Airway Management Planned: Oral ETT  Additional Equipment:   Intra-op Plan:   Post-operative Plan: Extubation in OR  Informed Consent: I have reviewed the patients History and Physical, chart, labs and discussed the procedure including the risks, benefits and alternatives for the proposed anesthesia with the patient or authorized representative who has indicated his/her understanding and acceptance.     Plan Discussed with: CRNA and Surgeon  Anesthesia Plan Comments:         Anesthesia Quick Evaluation

## 2012-05-08 NOTE — Anesthesia Postprocedure Evaluation (Signed)
  Anesthesia Post-op Note  Patient: Jeffery [name]  Procedure(s) Performed: Procedure(s) with comments: Lumbar Threee to Four Diskectomy/fusion/cages/posterolateral arthrodesis/pedicle screws (N/A) - POSTERIOR LUMBAR FUSION 1 LEVEL/cellsaver  Patient Location: PACU  Anesthesia Type:General  Level of Consciousness: awake  Airway and Oxygen Therapy: Patient Spontanous Breathing  Post-op Pain: mild  Post-op Assessment: Post-op Vital signs reviewed, Patient's Cardiovascular Status Stable, Respiratory Function Stable, Patent Airway, No signs of Nausea or vomiting and Pain level controlled  Post-op Vital Signs: stable  Complications: No apparent anesthesia complications

## 2012-05-08 NOTE — Transfer of Care (Signed)
Immediate Anesthesia Transfer of Care Note  Patient: Jeffery [name]  Procedure(s) Performed: Procedure(s) with comments: Lumbar Threee to Four Diskectomy/fusion/cages/posterolateral arthrodesis/pedicle screws (N/A) - POSTERIOR LUMBAR FUSION 1 LEVEL/cellsaver  Patient Location: PACU  Anesthesia Type:General  Level of Consciousness: oriented, sedated and patient cooperative  Airway & Oxygen Therapy: Patient Spontanous Breathing and Patient connected to nasal cannula oxygen  Post-op Assessment: Report given to PACU RN and Post -op Vital signs reviewed and stable  Post vital signs: Reviewed and stable  Complications: No apparent anesthesia complications

## 2012-05-08 NOTE — H&P (Signed)
Jeffery [name] is an 52 y.o. male.   Chief Complaint: lbp HPI: lower back pain with radiation to both lower extremities with numbness to the groin and right knee. He has failed with conservative treatment.   Past Medical History  Diagnosis Date  . Hyperlipidemia     takes Crestor daily  . Hypertension     pt states since gaining weight B/P is elevated not on meds  . Insomnia     takes Ambien and Melatonin  . Seasonal allergies     takes Claritin daily  . Chronic back pain     DDD  . Trigger finger     right   . Rash     pt states always has  . Psoriasis   . Umbilical hernia   . Hemorrhoids   . History of colon polyps   . UTI (lower urinary tract infection)     last time a month ago and was treated  . History of kidney stones     Past Surgical History  Procedure Laterality Date  . Cystectomy  2008    back  . Shoulder surgery Right     bone spur  . Tendon repair Right     ankle  . Lipoma excision      right arm   . Hernia repair  70's  . Colonoscopy      Family History  Problem Relation Age of Onset  . Cancer Mother     breast   . Cancer Maternal Grandfather     lung   Social History:  reports that he has quit smoking. He does not have any smokeless tobacco history on file. He reports that he does not drink alcohol or use illicit drugs.  Allergies:  Allergies  Allergen Reactions  . Morphine And Related Itching    Medications Prior to Admission  Medication Sig Dispense Refill  . Armodafinil (NUVIGIL) 150 MG tablet Take 150 mg by mouth daily.      . fish oil-omega-3 fatty acids 1000 MG capsule Take 1 g by mouth daily.       Marland Kitchen loratadine-pseudoephedrine (CLARITIN-D 24-HOUR) 10-240 MG per 24 hr tablet Take 1 tablet by mouth daily.      . Melatonin 10 MG TABS Take 1 tablet by mouth at bedtime.      . Nutritional Supplements (JUICE PLUS FIBRE PO) Take 2 capsules by mouth every morning. "[employer]      . Nutritional Supplements (JUICE PLUS FIBRE PO) Take 2  capsules by mouth at bedtime. "[employer]      . rosuvastatin (CRESTOR) 5 MG tablet Take 2.5 mg by mouth daily.      Marland Kitchen testosterone enanthate (DELATESTRYL) 200 MG/ML injection Inject 200 mg into the muscle every 7 (seven) days. Saturday or Sunday      . vitamin B-12 (CYANOCOBALAMIN) 500 MCG tablet Take 500 mcg by mouth daily.      Marland Kitchen zolpidem (AMBIEN) 10 MG tablet Take 10 mg by mouth at bedtime as needed.        No results found for this or any previous visit (from the past 48 hour(s)). No results found.  Review of Systems  Constitutional: Negative.   HENT: Negative.   Eyes: Negative.   Respiratory: Negative.   Cardiovascular: Negative.   Gastrointestinal: Negative.   Genitourinary:       Kidney stones  Musculoskeletal: Positive for back pain.  Skin: Negative.   Neurological: Positive for focal weakness.  Endo/Heme/Allergies: Negative.  Psychiatric/Behavioral: Negative.     Blood pressure 152/99, pulse 78, temperature 98.1 F (36.7 C), temperature source Oral, resp. rate 20, height 6' (1.829 m), weight 106.595 kg (235 lb), SpO2 93.00%. Physical Exam hent, nl. Neck, nl. Lungs clear. Cv, nl. Abdomen, soft. Extremities, nl. NEURO WEAKNESS OF BOTH ILIOPSOAS  As well las abductor and adductor muscles. Weakness with squating.  Radiological studies shows a grade 2 spondylolisthesis at l34  Assessment/Plan decompresion anf fusion at l3-4 with cages and screws. . Wife and patient are aware of risks and benefits with the surgery  Abbas Beyene M 05/08/2012, 10:02 AM

## 2012-05-08 NOTE — Anesthesia Procedure Notes (Signed)
Procedure Name: Intubation Date/Time: 05/08/2012 10:25 AM Performed by: Lovie Chol Pre-anesthesia Checklist: Patient identified, Emergency Drugs available, Suction available, Patient being monitored and Timeout performed Patient Re-evaluated:Patient Re-evaluated prior to inductionOxygen Delivery Method: Circle system utilized Preoxygenation: Pre-oxygenation with 100% oxygen Intubation Type: IV induction Ventilation: Mask ventilation without difficulty and Oral airway inserted - appropriate to patient size Laryngoscope Size: Miller and 2 Grade View: Grade I Tube type: Oral Tube size: 7.5 mm Number of attempts: 1 Airway Equipment and Method: Stylet Placement Confirmation: ETT inserted through vocal cords under direct vision,  positive ETCO2,  CO2 detector and breath sounds checked- equal and bilateral Secured at: 22 cm Tube secured with: Tape Dental Injury: Teeth and Oropharynx as per pre-operative assessment

## 2012-05-08 NOTE — Progress Notes (Signed)
Op [phone]

## 2012-05-09 MED ORDER — OXYCODONE HCL 5 MG PO TABS
15.0000 mg | ORAL_TABLET | ORAL | Status: DC | PRN
  Administered 2012-05-09: 10 mg via ORAL
  Administered 2012-05-09: 15 mg via ORAL
  Administered 2012-05-10 (×2): 5 mg via ORAL
  Filled 2012-05-09: qty 1
  Filled 2012-05-09: qty 2
  Filled 2012-05-09 (×2): qty 1
  Filled 2012-05-09: qty 3

## 2012-05-09 MED ORDER — DIAZEPAM 5 MG PO TABS
10.0000 mg | ORAL_TABLET | Freq: Four times a day (QID) | ORAL | Status: DC | PRN
  Administered 2012-05-09: 5 mg via ORAL
  Administered 2012-05-10: 10 mg via ORAL
  Administered 2012-05-10 (×2): 5 mg via ORAL
  Filled 2012-05-09 (×4): qty 1

## 2012-05-09 NOTE — Clinical Social Work Note (Signed)
Clinical Social Work   CSW reviewed chart. PT/OT are not recommending any follow up. CSW will update [facility]. CSW is signing off as no further needs identified. Please reconsult if a need arises prior to discharge.   Dede Query, MSW, LCSW [phone]

## 2012-05-09 NOTE — Progress Notes (Signed)
Utilization review completed. Nickole Adamek, RN, BSN. 

## 2012-05-09 NOTE — Op Note (Signed)
[name], [name] NO.:  [id number]  MEDICAL RECORD NO.:  [id number]  LOCATION:  [id number]                        FACILITY:  [facility]  PHYSICIAN:  Hilda Lias, M.D.   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  PROCEDURE:  L3 laminectomy and facetectomy.  Bilateral L3-L4 diskectomy, decompression of the L3-L4 nerve root.  Lysis of adhesion.  Cages 8 x 22 bilaterally pedicle screws L3-L4 with posterolateral arthrodesis with autograft bone extender.  Cell Saver, C-arm.  SURGEON:  Hilda Lias, M.D.  ASSISTANT:  Dr. Okey Dupre.  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  He has failed with every single conservative treatment.  X- rays showed that he has grade 2 spondylolisthesis at the level of L3-4 with chronic radiculopathy.  He has weakness of the proximal legs. Surgery was advised and he and his wife knew the risk of the surgery including the possibility of infection, CSF leak, no improvement whatsoever.  PROCEDURE:  The patient was taken to the OR, and after intubation, he was positioned in a prone manner.  The back was cleaned with DuraPrep and drapes were applied.  X-ray localized that we were right at the tip of L3.  Then, midline incision was made from 3 to 4-5 and muscle retracted laterally.  The patient is quite muscular and it is quite difficult to do retraction, but at the end using IV muscle relaxer, we were able to go laterally until we were able to feel the transverse process of the L3-4.  Indeed the posterior arch of L3 was loose and we proceeded with removal of the  spinous process, lamina, and the facet. We found quite a bit of scar tissue, the right worse than the left one, which clinically was his main complaint.  Lysis was accomplished.  We entered the disk space with the knife first and then using a small diameter, we were able to do a total gross diskectomy.  At the end, we were able to introduce 2 cages of 8 x 22 with autograft and bone extender.  Lateral lumbar x-ray showed good position of the cages.  Then using the C-arm in AP and then a lateral view, we probed the pedicle flap 3 and 4, and we introduced at the end 4 screws of 6.5 x 45.  Prior to introducing the screws, we probed hole just to be sure that we were surrounded by bone. Then, a rod was used to keep the pedicle screws in place as well as Capps.  Then, we went laterally and we removed the periosteum of the lateral aspect of 3-4 and the rest via autograft and bone extender were used for arthrodesis.  Crosslink was used from right to left.  Large drain was left and the wound was closed with Vicryl and Steri-Strips.  ______________________________ Hilda Lias, M.D.     EB/MEDQ  D:  05/08/2012  T:  05/09/2012  Job:  [id number]

## 2012-05-09 NOTE — Clinical Social Work Note (Signed)
Clinical Social Work   CSW received consult for SNF. CSW reviewed chart and discussed pt with RN during progression. Awaiting PT/OT evals for discharge recommendations. PT/OT evals are needed for SNF placement prior auth. CSW will assess for SNF, if apprioriate. Please call with any urgent needs. CSW will continue to follow.   Dede Query, MSW, LCSW [phone]

## 2012-05-09 NOTE — Evaluation (Signed)
Physical Therapy Evaluation Patient Details Name: Jeffery [name] MRN: [id number] DOB: 1960-05-11 Today's Date: 05/09/2012 Time: 1136-1203 PT Time Calculation (min): 27 min  PT Assessment / Plan / Recommendation Clinical Impression  52 y.o. male admitted for L3 laminectomy, discectomy at L3-L4, decompression of nerve root, and fusion. Pt doing very well with mobility, he ambulated 400' with RW independently. OK to DC home from PT standpoint.     PT Assessment  Patient needs continued PT services    Follow Up Recommendations  No PT follow up    Does the patient have the potential to tolerate intense rehabilitation      Barriers to Discharge None      Equipment Recommendations  Rolling walker with 5" wheels    Recommendations for Other Services OT consult   Frequency Min 6X/week    Precautions / Restrictions Precautions Precautions: Back Precaution Comments: reviewed twice that pt needs to make proper body mechanics a lifestyle change. Restrictions Weight Bearing Restrictions: No   Pertinent Vitals/Pain *6/10 incision pain with walking Pt premedicated**      Mobility  Bed Mobility Bed Mobility: Rolling Left;Left Sidelying to Sit;Sitting - Scoot to Edge of Bed Rolling Left: 6: Modified independent (Device/Increase time);With rail Left Sidelying to Sit: 4: Min guard;With rails;HOB flat Sitting - Scoot to Edge of Bed: 6: Modified independent (Device/Increase time) Transfers Sit to Stand: 4: Min guard;From bed;With armrests Stand to Sit: 5: Supervision;With armrests;To chair/3-in-1 Details for Transfer Assistance: cues for proper hand positioning Ambulation/Gait Ambulation/Gait Assistance: 6: Modified independent (Device/Increase time) Ambulation Distance (Feet): 400 Feet Assistive device: Rolling walker Gait Pattern: Within Functional Limits General Gait Details: steady, no LOB    Exercises     PT Diagnosis: Acute pain  PT Problem List: Pain;Decreased mobility PT  Treatment Interventions: Stair training;Gait training;Patient/family education   PT Goals Acute Rehab PT Goals PT Goal Formulation: With patient Time For Goal Achievement: 05/11/12 Potential to Achieve Goals: Good Pt will Go Up / Down Stairs: 1-2 stairs;with modified independence PT Goal: Up/Down Stairs - Progress: Goal set today  Visit Information  Last PT Received On: 05/09/12 Assistance Needed: +1 PT/OT Co-Evaluation/Treatment: Yes    Subjective Data  Subjective: I already walked in the hallway.  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 Dominant Hand: Right    Cognition  Cognition Overall Cognitive Status: Appears within functional limits for tasks assessed/performed Arousal/Alertness: Awake/alert Orientation Level: Oriented X4 / Intact Behavior During Session: WFL for tasks performed    Extremity/Trunk Assessment Right Upper Extremity Assessment RUE ROM/Strength/Tone: Within functional levels RUE Sensation: WFL - Light Touch RUE Coordination: WFL - gross/fine motor Left Upper Extremity Assessment LUE ROM/Strength/Tone: Within functional levels LUE Sensation: WFL - Light Touch LUE Coordination: WFL - gross/fine motor Right Lower Extremity Assessment RLE ROM/Strength/Tone: Within functional levels RLE Sensation: WFL - Light Touch RLE Coordination: WFL - gross/fine motor Left Lower Extremity Assessment LLE ROM/Strength/Tone:  Within functional levels LLE Sensation: WFL - Light Touch LLE Coordination: WFL - gross/fine motor Trunk Assessment Trunk Assessment: Normal   Balance    End of  Session PT - End of Session Activity Tolerance: Patient tolerated treatment well Patient left: in chair;with call bell/phone within reach Nurse Communication: Mobility status  GP     Ralene Bathe Kistler 05/09/2012, 1:25 PM [phone]

## 2012-05-09 NOTE — Evaluation (Signed)
Occupational Therapy Evaluation and Discharge Summary Patient Details Name: Jeffery [name] MRN: [id number] DOB: [birth year] Today's Date: 05/09/2012 Time: 4098-1191 OT Time Calculation (min): 29 min  OT Assessment / Plan / Recommendation Clinical Impression  Pt is a 52 yo male admitted for L3-4 decompression and fusion with L3 facetectomy who overall is doing very well with adls.  Pt has wife to assist at home with LE dressing for now.   All education is complete.  Will d/c OT.    OT Assessment  Patient does not need any further OT services    Follow Up Recommendations  No OT follow up    Barriers to Discharge      Equipment Recommendations  None recommended by OT    Recommendations for Other Services    Frequency       Precautions / Restrictions Precautions Precautions: Back Precaution Comments: reviewed twice that pt needs to make proper body mechanics a lifestyle change. Restrictions Weight Bearing Restrictions: No   Pertinent Vitals/Pain Pt with 4/10 pain.    ADL  Eating/Feeding: Performed;Independent Where Assessed - Eating/Feeding: Chair Grooming: Performed;Wash/dry hands;Modified independent Where Assessed - Grooming: Supported standing Upper Body Bathing: Simulated;Set up Where Assessed - Upper Body Bathing: Unsupported sitting Lower Body Bathing: Simulated;Moderate assistance Where Assessed - Lower Body Bathing: Supported sit to stand Upper Body Dressing: Performed;Set up Where Assessed - Upper Body Dressing: Unsupported sitting Lower Body Dressing: Performed;Moderate assistance Where Assessed - Lower Body Dressing: Supported sit to stand Toilet Transfer: Performed;Min Pension scheme manager Method: Sit to stand;Stand pivot Acupuncturist: Comfort height toilet;Grab bars Toileting - Architect and Hygiene: Performed;Min guard Where Assessed - Engineer, mining and Hygiene: Standing Equipment Used: Back brace;Rolling  walker Transfers/Ambulation Related to ADLs: Pt walked in room and in hall with RW and S. ADL Comments: Pt only needs assist for getting started wtih LE adls. Pt has a Sports administrator.  Educated pt on sock aid but pt not interested.    OT Diagnosis:    OT Problem List:   OT Treatment Interventions:     OT Goals    Visit Information  Last OT Received On: 05/09/12 Assistance Needed: +1 PT/OT Co-Evaluation/Treatment: Yes    Subjective Data  Subjective: " I have been doing my laps." Patient Stated Goal: to go home   Prior Functioning     Home Living Lives With: Spouse Available Help at Discharge: Family Type of Home: House Home Access: Stairs to enter Secretary/administrator of Steps: 1 Entrance Stairs-Rails: None Home Layout: One level Bathroom Shower/Tub: Health visitor: Standard Home Adaptive Equipment: Reacher;Built-in shower seat Prior Function Level of Independence: Independent Able to Take Stairs?: Yes Driving: Yes 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 Communication: No difficulties Dominant Hand: Right         Vision/Perception Vision - History Baseline Vision: No visual deficits Patient Visual Report: No change from baseline Vision - Assessment Eye Alignment: Within Functional Limits Vision Assessment: Vision not tested   [employer] Overall Cognitive Status: Appears within functional limits for tasks assessed/performed Arousal/Alertness: Awake/alert Orientation Level: Oriented X4 / Intact Behavior During Session: [facility] for tasks performed    Extremity/Trunk Assessment Right Upper Extremity Assessment RUE ROM/Strength/Tone: Within functional levels RUE Sensation: WFL - Light Touch RUE Coordination: WFL - gross/fine motor Left Upper Extremity Assessment LUE ROM/Strength/Tone: Within functional levels LUE Sensation: WFL - Light Touch LUE Coordination: WFL - gross/fine motor Trunk Assessment Trunk  Assessment: Normal     Mobility Bed  Mobility Bed Mobility: Rolling Left;Left Sidelying to Sit;Sitting - Scoot to Edge of Bed Rolling Left: 6: Modified independent (Device/Increase time);With rail Left Sidelying to Sit: 4: Min guard;With rails;HOB flat Sitting - Scoot to Edge of Bed: 6: Modified independent (Device/Increase time) Transfers Transfers: Sit to Stand;Stand to Sit Sit to Stand: 4: Min guard;From bed;With armrests Stand to Sit: 5: Supervision;With armrests;To chair/3-in-1 Details for Transfer Assistance: cues for proper hand positioning     Exercise     Balance     End of Session OT - End of Session Activity Tolerance: Patient tolerated treatment well Patient left: in chair;with call bell/phone within reach Nurse Communication: Mobility status  GO     Hope Budds 05/09/2012, 12:13 PM [phone]

## 2012-05-09 NOTE — Progress Notes (Signed)
Patient ID: Jeffery [name], male   DOB: Aug 27, 1960, 52 y.o.   MRN: [id number] Off morphine, c/o muscle spasms. No weakness. ambulating

## 2012-05-10 MED FILL — Sodium Chloride IV Soln 0.9%: INTRAVENOUS | Qty: 1000 | Status: AC

## 2012-05-10 MED FILL — Sodium Chloride Irrigation Soln 0.9%: Qty: 3000 | Status: AC

## 2012-05-10 MED FILL — Heparin Sodium (Porcine) Inj 1000 Unit/ML: INTRAMUSCULAR | Qty: 30 | Status: AC

## 2012-05-10 NOTE — Discharge Summary (Signed)
Physician Discharge Summary  Patient ID: Jeffery [name] MRN: [id number] DOB/AGE: [birth year] 52 y.o.  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 *   Discharged Conditionno pain  Hospital Course: surgery  Consults: none  Significant Diagnostic Studies: mri  Treatments: lumbar fusion  Discharge Exam: Blood pressure 137/55, pulse 87, temperature 98.4 F (36.9 C), temperature source Oral, resp. rate 19, height 6' (1.829 m), weight 106.595 kg (235 lb), SpO2 96.00%. No findings. Wound dry  Disposition: home     Medication List    ASK your doctor about these medications       AMBIEN 10 MG tablet  Generic drug:  zolpidem  Take 10 mg by mouth at bedtime as needed.     CRESTOR 5 MG tablet  Generic drug:  rosuvastatin  Take 2.5 mg by mouth daily.     fish oil-omega-3 fatty acids 1000 MG capsule  Take 1 g by mouth daily.     JUICE PLUS FIBRE PO  Take 2 capsules by mouth every morning. "Garden Blend"     JUICE PLUS FIBRE PO  Take 2 capsules by mouth at bedtime. "Orchard Blend"     loratadine-pseudoephedrine 10-240 MG per 24 hr tablet  Commonly known as:  CLARITIN-D 24-hour  Take 1 tablet by mouth daily.     Melatonin 10 MG Tabs  Take 1 tablet by mouth at bedtime.     NUVIGIL 150 MG tablet  Generic drug:  Armodafinil  Take 150 mg by mouth daily.     testosterone enanthate 200 MG/ML injection  Commonly known as:  DELATESTRYL  Inject 200 mg into the muscle every 7 (seven) days. Saturday or Sunday     vitamin B-12 500 MCG tablet  Commonly known as:  CYANOCOBALAMIN  Take 500 mcg by mouth daily.         Signed: Karn Cassis 05/10/2012, 5:53 PM

## 2012-05-10 NOTE — Progress Notes (Signed)
Physical Therapy Treatment Patient Details Name: Jeffery [name] MRN: [id number] DOB: [birth year] Today's Date: 05/10/2012 Time:  -     PT Assessment / Plan / Recommendation Comments on Treatment Session  Pt moving very well.  Trialed ambulation without RW.  No LOB noted.   Pt has been observed ambulating hallway with wife present.  Pt cont's to demonstrate safe mobilization & appropriate to d/c home when MD feels medically ready.      Follow Up Recommendations  No PT follow up     Does the patient have the potential to tolerate intense rehabilitation     Barriers to Discharge        Equipment Recommendations  Rolling walker with 5" wheels    Recommendations for Other Services    Frequency Min 6X/week   Plan Discharge plan remains appropriate    Precautions / Restrictions Precautions Precautions: Back Precaution Comments: Pt able to recall all 3 back precautions but required min cueing while ambulating to reinforce "no twisting" Restrictions Weight Bearing Restrictions: No       Mobility  Bed Mobility Bed Mobility: Rolling Right;Right Sidelying to Sit;Sitting - Scoot to Edge of Bed Rolling Right: 6: Modified independent (Device/Increase time) Right Sidelying to Sit: 6: Modified independent (Device/Increase time);HOB flat Sitting - Scoot to Edge of Bed: 6: Modified independent (Device/Increase time) Transfers Transfers: Sit to Stand;Stand to Sit Sit to Stand: 5: Supervision;With upper extremity assist;From bed Stand to Sit: 6: Modified independent (Device/Increase time) Details for Transfer Assistance: cues for safest hand placement Ambulation/Gait Ambulation/Gait Assistance: 5: Supervision Ambulation Distance (Feet): 400 Feet Assistive device: None Ambulation/Gait Assistance Details: Trialed ambulation without AD.  No LOB noted.   Gait Pattern: Within Functional Limits Gait velocity: slightly decreased General Gait Details: steady, no LOB Stairs: Yes Stairs Assistance:  5: Supervision Stairs Assistance Details (indicate cue type and reason): 2 Stair Management Technique: One rail Right Number of Stairs: 2 Wheelchair Mobility Wheelchair Mobility: No      PT Goals Acute Rehab PT Goals Time For Goal Achievement: 05/11/12 Potential to Achieve Goals: Good Pt will Go Up / Down Stairs: 1-2 stairs;with modified independence PT Goal: Up/Down Stairs - Progress: Progressing toward goal  Visit Information  Last PT Received On: 05/10/12 Assistance Needed: +1    Subjective Data      Cognition  Cognition Overall Cognitive Status: Appears within functional limits for tasks assessed/performed Arousal/Alertness: Awake/alert Orientation Level: Oriented X4 / Intact Behavior During Session: [facility] for tasks performed    Balance     End of Session PT - End of Session Equipment Utilized During Treatment: Back brace Activity Tolerance: Patient tolerated treatment well Patient left: Other (comment);with family/visitor present (sitting on couch by window) Nurse Communication: Mobility status     Verdell Face, [place] [phone] 05/10/2012

## 2012-05-10 NOTE — Progress Notes (Signed)
Patient ID: Jeffery [name], male   DOB: 1960/06/19, 52 y.o.   MRN: [id number] hemovac out. Taken only tyltnol for pain. Temp up once to 101 last night. Dc in the next 24 hours

## 2012-08-19 ENCOUNTER — Ambulatory Visit: Payer: 59 | Attending: Neurosurgery

## 2012-08-19 DIAGNOSIS — M545 Low back pain, unspecified: Secondary | ICD-10-CM | POA: Insufficient documentation

## 2012-08-19 DIAGNOSIS — IMO0001 Reserved for inherently not codable concepts without codable children: Secondary | ICD-10-CM | POA: Insufficient documentation

## 2012-08-19 DIAGNOSIS — M6281 Muscle weakness (generalized): Secondary | ICD-10-CM | POA: Insufficient documentation

## 2012-08-21 ENCOUNTER — Ambulatory Visit: Payer: 59 | Admitting: Physical Therapy

## 2012-08-23 ENCOUNTER — Ambulatory Visit: Payer: 59 | Admitting: Physical Therapy

## 2012-08-26 ENCOUNTER — Ambulatory Visit: Payer: 59 | Admitting: Physical Therapy

## 2012-08-28 ENCOUNTER — Ambulatory Visit: Payer: 59 | Admitting: Physical Therapy

## 2012-08-30 ENCOUNTER — Ambulatory Visit: Payer: 59 | Admitting: Physical Therapy

## 2013-07-18 ENCOUNTER — Other Ambulatory Visit: Payer: Self-pay | Admitting: Family Medicine

## 2013-07-18 DIAGNOSIS — R319 Hematuria, unspecified: Secondary | ICD-10-CM

## 2013-07-21 ENCOUNTER — Ambulatory Visit
Admission: RE | Admit: 2013-07-21 | Discharge: 2013-07-21 | Disposition: A | Discharge status: Home / Self Care | Payer: 59 | Source: Ambulatory Visit | Attending: Family Medicine | Admitting: Family Medicine

## 2013-07-21 DIAGNOSIS — R319 Hematuria, unspecified: Secondary | ICD-10-CM

## 2015-01-22 IMAGING — US US RENAL
1 series · 14 of 25 positions shown · non-contrast
Comparison: DG LUMBAR SPINE COMPLETE 4+V dated 06/02/2013; MR LUMBAR
SPINE W/O CM dated 03/04/2012; CT ABD/PELV WO CM dated 01/11/2012

CLINICAL DATA: Hematuria

EXAM:
RENAL/URINARY TRACT ULTRASOUND COMPLETE

[Series 1: us renal · 0.26mm/px · 14 of 37 slices shown]
[im 1/37]
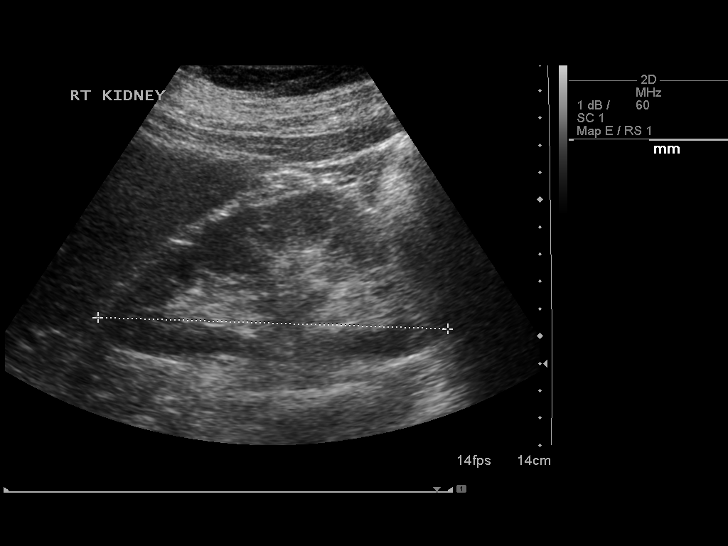
[im 4/37]
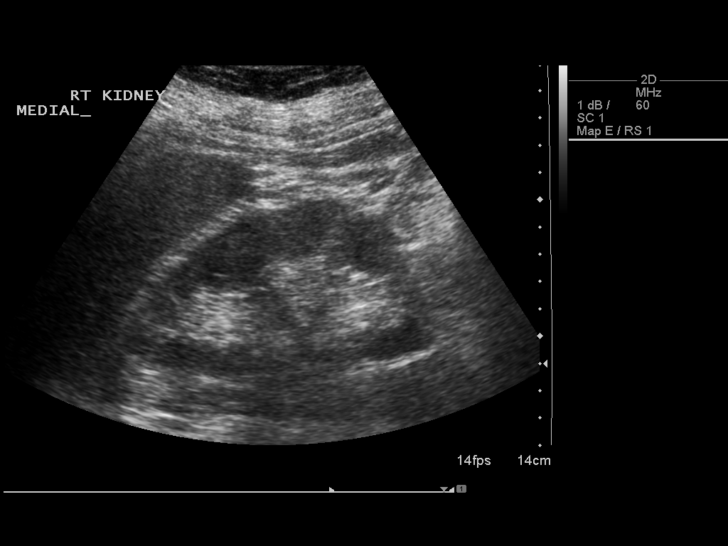
[im 7/37]
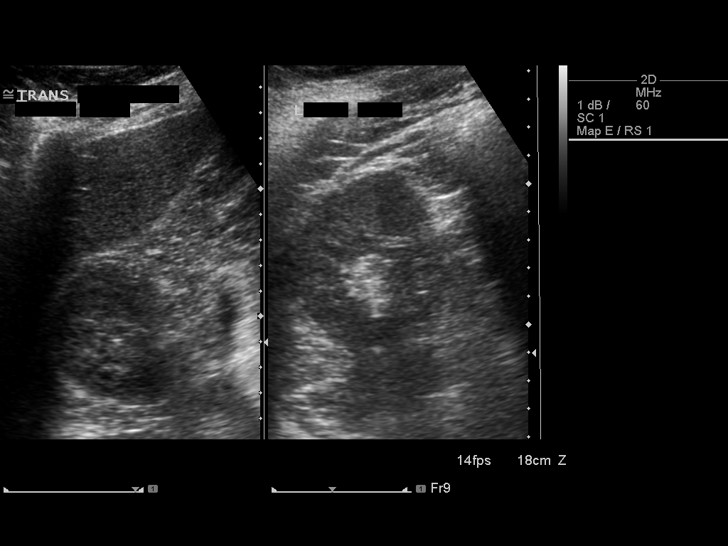
[im 10/37]
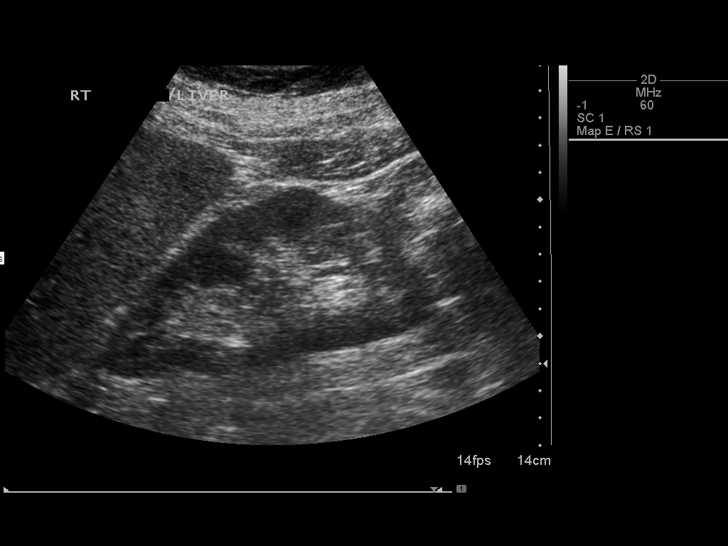
[im 13/37]
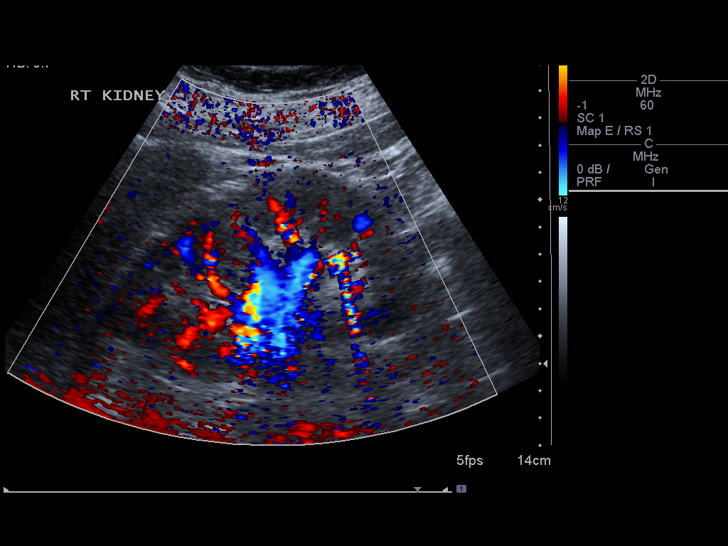
[im 14/37]
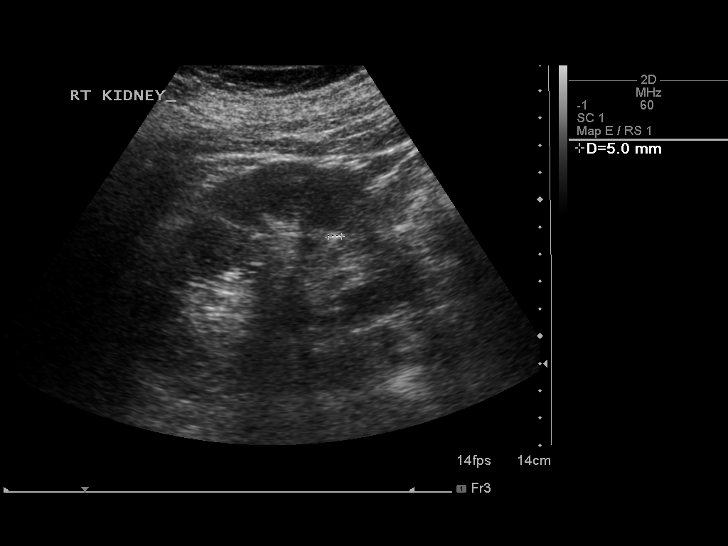
[im 17/37]
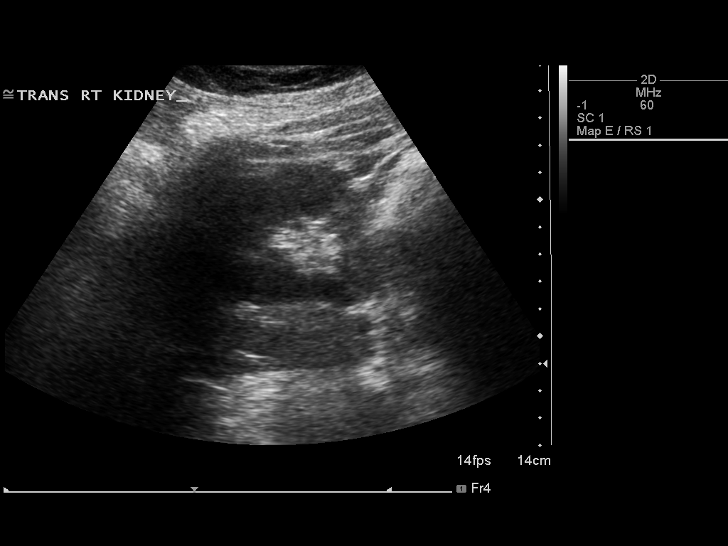
[im 20/37]
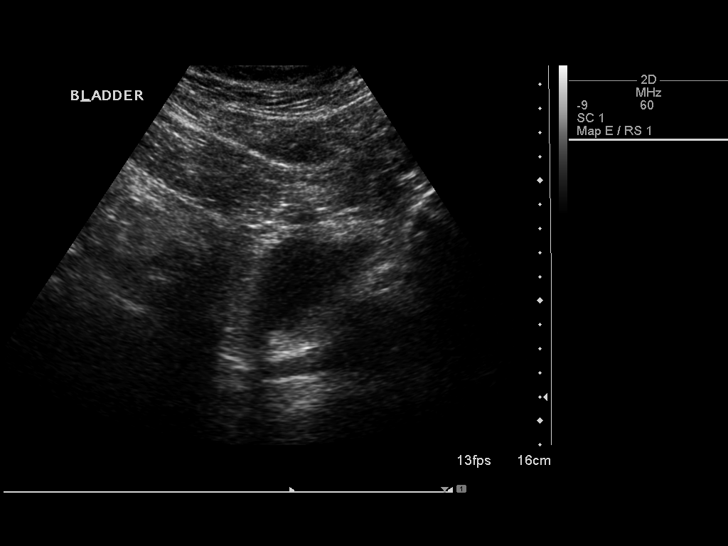
[im 23/37]
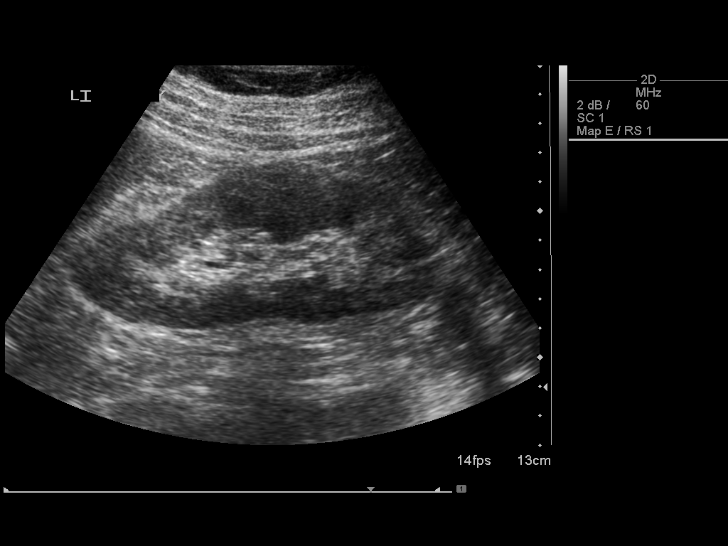
[im 25/37]
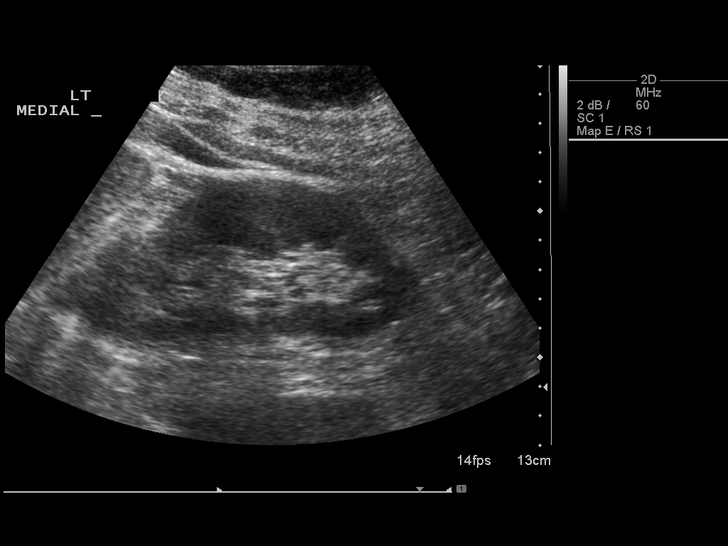
[im 28/37]
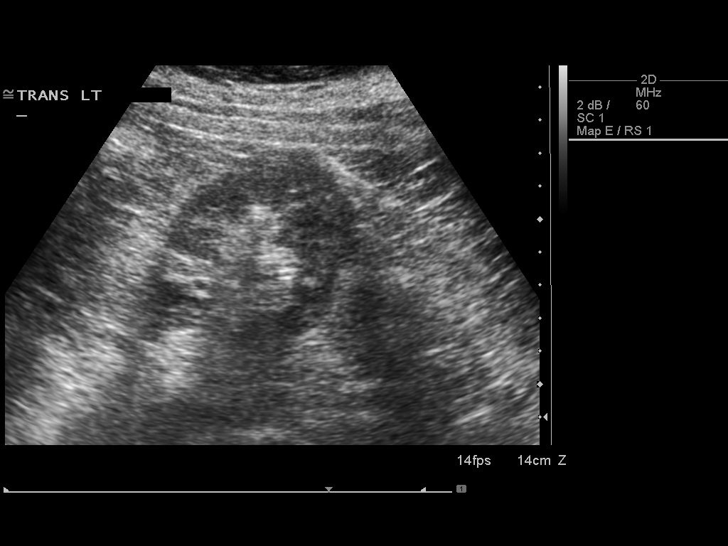
[im 31/37]
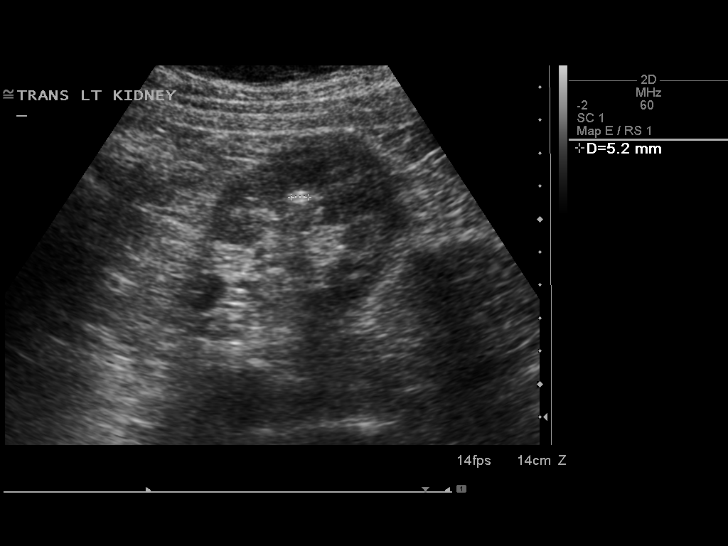
[im 34/37]
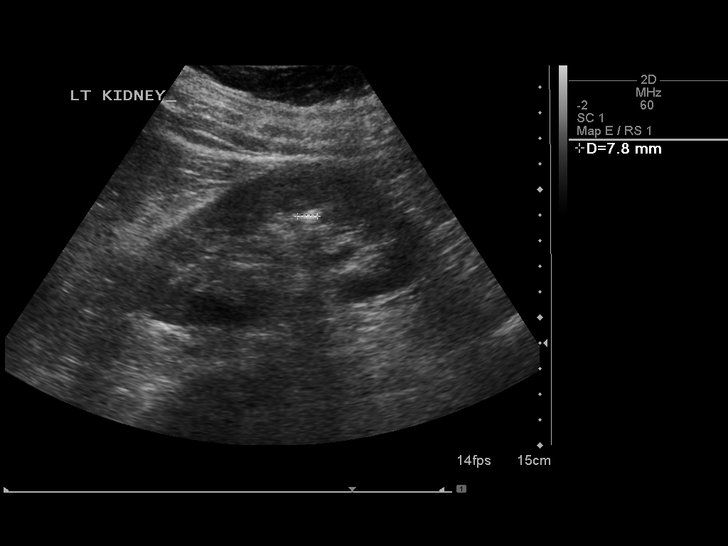
[im 37/37]
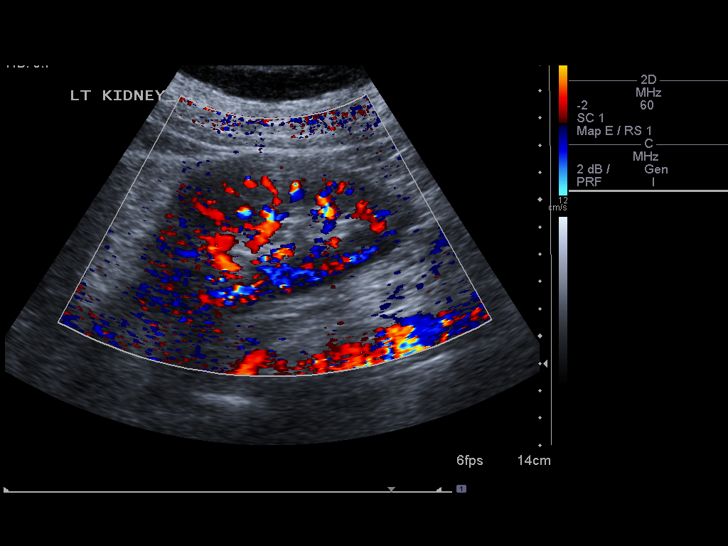

[14 of 25 positions shown; findings below may reference images not displayed]

FINDINGS: Right Kidney:

Length: 12.8 cm. Lower pole calculus in the 2-5 mm range. No
hydronephrosis or renal mass.

Left Kidney:

Length: 12.6 cm.. Mid kidney calculus in the 4-8 mm range. No
hydronephrosis or renal mass.

Bladder:

Nondistended, but otherwise without observed abnormality.
IMPRESSION: Single bilateral nonobstructive renal calculi are identified.

## 2015-04-24 ENCOUNTER — Other Ambulatory Visit (HOSPITAL_BASED_OUTPATIENT_CLINIC_OR_DEPARTMENT_OTHER): Payer: Self-pay | Admitting: Physician Assistant

## 2015-04-24 DIAGNOSIS — R1900 Intra-abdominal and pelvic swelling, mass and lump, unspecified site: Secondary | ICD-10-CM

## 2015-04-28 ENCOUNTER — Ambulatory Visit (HOSPITAL_BASED_OUTPATIENT_CLINIC_OR_DEPARTMENT_OTHER)
Admission: RE | Admit: 2015-04-28 | Discharge: 2015-04-28 | Disposition: A | Discharge status: Home / Self Care | Payer: 59 | Source: Ambulatory Visit | Attending: Physician Assistant | Admitting: Physician Assistant

## 2015-04-28 DIAGNOSIS — R1900 Intra-abdominal and pelvic swelling, mass and lump, unspecified site: Secondary | ICD-10-CM | POA: Diagnosis not present

## 2015-05-11 ENCOUNTER — Ambulatory Visit (INDEPENDENT_AMBULATORY_CARE_PROVIDER_SITE_OTHER): Payer: 59 | Admitting: Neurology

## 2015-05-11 ENCOUNTER — Encounter: Payer: Self-pay | Admitting: Neurology

## 2015-05-11 VITALS — BP 134/84 | HR 77 | Ht 70.0 in | Wt 268.8 lb

## 2015-05-11 DIAGNOSIS — G5600 Carpal tunnel syndrome, unspecified upper limb: Secondary | ICD-10-CM | POA: Insufficient documentation

## 2015-05-11 DIAGNOSIS — E669 Obesity, unspecified: Secondary | ICD-10-CM

## 2015-05-11 DIAGNOSIS — G5601 Carpal tunnel syndrome, right upper limb: Secondary | ICD-10-CM

## 2015-05-11 DIAGNOSIS — R0683 Snoring: Secondary | ICD-10-CM

## 2015-05-11 NOTE — Patient Instructions (Addendum)
Remember to drink plenty of fluid, eat healthy meals and do not skip any meals. Try to eat protein with a every meal and eat a healthy snack such as fruit or nuts in between meals. Try to keep a regular sleep-wake schedule and try to exercise daily, particularly in the form of walking, 20-30 minutes a day, if you can.   As far as your medications are concerned, I would like to suggest: No medication, wrist bracing at night  As far as diagnostic testing: emg/ncs  My clinical assistant and will answer any of your questions and relay your messages to me and also relay most of my messages to you.   Our phone number is [phone]. We also have an after hours call service for urgent matters and there is a physician on-call for urgent questions. For any emergencies you know to call 911 or go to the nearest emergency room

## 2015-05-11 NOTE — Progress Notes (Signed)
[facility]    Provider:  Dr Lucia Gaskins Referring Provider: Lovenia Kim, PA-C Primary Care Physician:  Ethel Rana  CC:  Hand numbness  HPI:  Jeffery [name] is a 55 y.o. male here as a referral from Dr. Tommi Emery for hand numbness. Past medical history peripheral edema, hypertension, insomnia, myalgia. The first 3 fingers are tingly and numb. He has to shake his hand out often due to the pain. Sometimes he can hear it click in the wrist. No neck pain, no radicular symptoms. He has gained 30 pounds in the last 3 months after taking supplements and thought possibly this was affecting him and causing the tingling. He is retired Nurse, adult. Tingling in the fingers started 2 months ago, getting better after a fluid pill still continues. Worse at night or in the morning. Discussed bracing at night. He snores a lot. He doesn't sleep well with ambien. This is left over from shift work. Dr. Tommi Emery would like him to have a sleep eval. His ESS in the office is 9. No weakness. No problems with opening jars. Weight gain preceded the symptoms. No other focal neurologic symptoms. No preceding illnesses. No weakness in the hand. No family history of neuropathy or neurologic disorders.   Reviewed notes, labs and imaging from outside physicians, which showed: Reviewed notes from [facility] physicians. Patient and his wife are taking supplements and gained 30 pounds and became edematous with worsening of tingling in the hand. Physician at [facility] treated patient for the swelling hoping that would help with the tingling, sat the swelling was possibly compressing the median nerve. Patient had significant swelling and was started on furosemide 40 mg twice a day. Despite resolution of swelling still having tingling in the right hand. Symptoms distracting him from sleeping. Reviewed lab values completed at [facility]: BMP normal with creatinine of 0.95. CPK was 128. Patient was referred to neurology for continued  numbness and tingling in the hand.  Review of Systems: Patient complains of symptoms per HPI as well as the following symptoms: weight gain, fatigue, . Pertinent negatives per HPI. All others negative.   Social History   Social History  . Marital Status: Married    Spouse Name: N/A  . Number of Children: 0  . Years of Education: 20   Occupational History  . Retired    Social History Main Topics  . Smoking status: Former Games developer  . Smokeless tobacco: Not on file     Comment: quit smoking 14yrs ago  . Alcohol Use: No  . Drug Use: No  . Sexual Activity: Yes   Other Topics Concern  . Not on file   Social History Narrative   Lives with wife, Jeffery [name]   Caffeine use: Drinks coffee daily (1-2 cups per day)    Family History  Problem Relation Age of Onset  . Cancer Mother     breast   . Cancer Maternal Grandfather     lung  . Colon cancer Father   . Parkinsonism Neg Hx     Past Medical History  Diagnosis Date  . Hyperlipidemia     takes Crestor daily  . Hypertension     pt states since gaining weight B/P is elevated not on meds  . Insomnia     takes Ambien and Melatonin  . Seasonal allergies     takes Claritin daily  . Chronic back pain     DDD  . Trigger finger     right   . Rash  pt states always has  . Psoriasis   . Umbilical hernia   . Hemorrhoids   . History of colon polyps   . UTI (lower urinary tract infection)     last time a month ago and was treated  . History of kidney stones     Past Surgical History  Procedure Laterality Date  . Cystectomy  2008    back  . Shoulder surgery Right     bone spur  . Tendon repair Right     ankle  . Lipoma excision      right arm   . Hernia repair  70's  . Colonoscopy      Current Outpatient Prescriptions  Medication Sig Dispense Refill  . loratadine-pseudoephedrine (CLARITIN-D 24-HOUR) 10-240 MG per 24 hr tablet Take 1 tablet by mouth daily.    . rosuvastatin (CRESTOR) 5 MG tablet Take 2.5 mg by  mouth daily.    Marland Kitchen. testosterone enanthate (DELATESTRYL) 200 MG/ML injection Inject 200 mg into the muscle every 7 (seven) days. Saturday or Sunday    . traMADol (ULTRAM) 50 MG tablet Take 50 mg by mouth at bedtime.    . vitamin B-12 (CYANOCOBALAMIN) 500 MCG tablet Take 500 mcg by mouth daily.    Marland Kitchen. zolpidem (AMBIEN) 10 MG tablet Take 10 mg by mouth at bedtime as needed.     No current facility-administered medications for this visit.    Allergies as of 05/11/2015 - Review Complete 05/11/2015  Allergen Reaction Noted  . Morphine and related Itching 08/31/2011    Vitals: BP 134/84 mmHg  Pulse 77  Ht 5\' 10"  (1.778 m)  Wt 268 lb 12.8 oz (121.927 kg)  BMI 38.57 kg/m2 Last Weight:  Wt Readings from Last 1 Encounters:  05/11/15 268 lb 12.8 oz (121.927 kg)   Last Height:   Ht Readings from Last 1 Encounters:  05/11/15 5\' 10"  (1.778 m)    Physical exam: Exam: Gen: NAD, conversant, well nourised, obese, well groomed                     CV: RRR, no MRG. No Carotid Bruits. No peripheral edema, warm, nontender Eyes: Conjunctivae clear without exudates or hemorrhage  Neuro: Detailed Neurologic Exam  Speech:    Speech is normal; fluent and spontaneous with normal comprehension.  Cognition:    The patient is oriented to person, place, and time;     recent and remote memory intact;     language fluent;     normal attention, concentration,     fund of knowledge Cranial Nerves:    The pupils are equal, round, and reactive to light. The fundi are normal and spontaneous venous pulsations are present. Visual fields are full to finger confrontation. Extraocular movements are intact. Trigeminal sensation is intact and the muscles of mastication are normal. The face is symmetric. The palate elevates in the midline. Hearing intact. Voice is normal. Shoulder shrug is normal. The tongue has normal motion without fasciculations.   Coordination:    Normal finger to nose and heel to shin. Normal  rapid alternating movements.   Gait:    Heel-toe and tandem gait are normal.   Motor Observation:    No asymmetry, no atrophy, and no involuntary movements noted. Tone:    Normal muscle tone.    Posture:    Posture is normal. normal erect    Strength:    Strength is V/V in the upper and lower limbs.  Sensation: intact to LT     Reflex Exam:  DTR's: absent right patellar.    Deep tendon reflexes in the upper and lower extremities are normal bilaterally.   Toes:    The toes are downgoing bilaterally.   Clonus:    Clonus is absent.  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

## 2015-05-16 ENCOUNTER — Encounter: Payer: Self-pay | Admitting: Neurology

## 2015-06-22 ENCOUNTER — Encounter: Payer: 59 | Admitting: Neurology

## 2015-07-12 ENCOUNTER — Ambulatory Visit (INDEPENDENT_AMBULATORY_CARE_PROVIDER_SITE_OTHER): Payer: 59 | Admitting: Neurology

## 2015-07-12 ENCOUNTER — Ambulatory Visit (INDEPENDENT_AMBULATORY_CARE_PROVIDER_SITE_OTHER): Payer: Self-pay | Admitting: Neurology

## 2015-07-12 DIAGNOSIS — G5601 Carpal tunnel syndrome, right upper limb: Secondary | ICD-10-CM | POA: Diagnosis not present

## 2015-07-12 DIAGNOSIS — Z0289 Encounter for other administrative examinations: Secondary | ICD-10-CM

## 2015-07-12 DIAGNOSIS — G5603 Carpal tunnel syndrome, bilateral upper limbs: Secondary | ICD-10-CM

## 2015-07-12 NOTE — Progress Notes (Signed)
  [facility]    Provider:  Dr Lucia Gaskins Referring Provider: Lovenia Kim, PA-C Primary Care Physician:  Ethel Rana   HPI:  Jeffery [name] is a 55 y.o. male here as a referral from Dr. Tommi Emery for carpal tunnel syndrome. Patient is feeling much better. He lost about 30 pounds. The symptoms in his right hand are minimal now and he has no symptoms in his left hand. Discussed the findings of carpal tunnel syndrome based on nerve conductions, patient would like to minimize the EMG needle portion because his symptoms have improved so much and he does not want any intervention at this point. Can just do the opponents pollicis on the right side, patient doesn't have any neck pain so likely radiculopathy however cannot rule that out without more extensive EMG needle study. Patient understands that and just wants the opponens done  Summary:   Nerve Conduction Studies were performed on the bilateral upper extremities.  The right median APB motor nerve showed prolonged distal onset latency (6.9 ms, N<4.0). The right Median 2nd Digit sensory nerve showed prolonged distal peak latency (6.4 ms, N<3.9) and reduced amplitude (5 V, N greater than 10)  F Wave studies indicate that the right Median F wave has delayed latency(41.9, N<50ms).  The left median APB motor nerve showed prolonged distal onset latency (4.9 ms, N<4.0). The left Median 2nd Digit sensory nerve showed prolonged distal peak latency (5.0 ms, N<3.9).  F Wave studies indicate that the left Median F wave has delayed latency(35.8, N<61ms).   Bilateral Ulnar ADM motor nerves were within normal limits. F Wave studies indicate that the bilateral Ulnar F waves have normal latencies The bilateralUlnar 5th digit sensory nerves were within normal limits.   EMG needle study of selected right upper extremity muscles was  performed. The following muscles were normal:  Opponens Pollicis, patient declined any more muscles.    Conclusion: This is an abnormal study. There is electrophysiologic evidence of bilateral moderately-severe right > left Carpal Tunnel Syndrome.    Naomie Dean, MD  [facility] [address] [place], [place] [address]  Phone [phone] Fax [phone]

## 2015-07-12 NOTE — Progress Notes (Signed)
Se procedure note

## 2015-07-13 NOTE — Procedures (Signed)
[facility]    Provider:  Dr Lucia GaskinsAhern Referring Provider: Lovenia KimHepler, Mark, PA-C Primary Care Physician:  Ethel RanaHEPLER,MARK, PA-C   HPI:  Jeffery [name] is a 55 y.o. male here as a referral from Dr. Tommi EmeryHepler for carpal tunnel syndrome. Patient is feeling much better. He lost about 30 pounds. The symptoms in his right hand are minimal now and he has no symptoms in his left hand. Discussed the findings of carpal tunnel syndrome based on nerve conductions, patient would like to minimize the EMG needle portion because his symptoms have improved so much and he does not want any intervention at this point. Can just do the opponents pollicis on the right side, patient doesn't have any neck [name] so likely radiculopathy however cannot rule that out without more extensive EMG needle study. Patient understands that and just wants the opponens done  Summary:   Nerve Conduction Studies were performed on the bilateral upper extremities.  The right median APB motor nerve showed prolonged distal onset latency (6.9 ms, N<4.0). The right Median 2nd Digit sensory nerve showed prolonged distal peak latency (6.4 ms, N<3.9) and reduced amplitude (5 V, N greater than 10)  F Wave studies indicate that the right Median F wave has delayed latency(41.9, N<7234ms).  The left median APB motor nerve showed prolonged distal onset latency (4.9 ms, N<4.0). The left Median 2nd Digit sensory nerve showed prolonged distal peak latency (5.0 ms, N<3.9).  F Wave studies indicate that the left Median F wave has delayed latency(35.8, N<2934ms).   Bilateral Ulnar ADM motor nerves were within normal limits. F Wave studies indicate that the bilateral Ulnar F waves have normal latencies The bilateralUlnar 5th digit sensory nerves were within normal limits.   EMG needle study of selected right upper extremity muscles was  performed. The following muscles were normal:  Opponens Pollicis, patient declined any more muscles.    Conclusion: This is an abnormal study. There is electrophysiologic evidence of bilateral moderately-severe right > left Carpal Tunnel Syndrome.    Naomie DeanAntonia Kamirah Shugrue, MD  [facility] [address] [place], [place] [id number]  Phone [phone] Fax [phone]
